# Patient Record
Sex: Female | Born: 1980 | Race: White | Hispanic: No | Marital: Single | Smoking: Current every day smoker
Health system: Southern US, Community
[De-identification: ages and names within clinical notes are randomized; demographics above are authoritative.]

## PROBLEM LIST (undated history)

## (undated) HISTORY — PX: TUBAL LIGATION: SHX77

## (undated) HISTORY — PX: APPENDECTOMY: SHX54

---

## 2002-09-22 ENCOUNTER — Other Ambulatory Visit: Admission: RE | Admit: 2002-09-22 | Discharge: 2002-09-22 | Payer: Self-pay

## 2003-10-14 ENCOUNTER — Other Ambulatory Visit: Admission: RE | Admit: 2003-10-14 | Discharge: 2003-10-14 | Payer: Self-pay | Admitting: Obstetrics and Gynecology

## 2004-04-28 ENCOUNTER — Inpatient Hospital Stay (HOSPITAL_COMMUNITY): Admission: AD | Admit: 2004-04-28 | Discharge: 2004-05-01 | Payer: Self-pay | Admitting: Obstetrics and Gynecology

## 2005-07-03 ENCOUNTER — Ambulatory Visit: Payer: Self-pay | Admitting: Family Medicine

## 2005-12-17 ENCOUNTER — Emergency Department (HOSPITAL_COMMUNITY): Admission: EM | Admit: 2005-12-17 | Discharge: 2005-12-17 | Payer: Self-pay | Admitting: Emergency Medicine

## 2005-12-18 ENCOUNTER — Emergency Department (HOSPITAL_COMMUNITY): Admission: EM | Admit: 2005-12-18 | Discharge: 2005-12-18 | Payer: Self-pay | Admitting: Emergency Medicine

## 2005-12-18 ENCOUNTER — Inpatient Hospital Stay (HOSPITAL_COMMUNITY): Admission: AD | Admit: 2005-12-18 | Discharge: 2005-12-20 | Payer: Self-pay | Admitting: Obstetrics and Gynecology

## 2006-01-14 ENCOUNTER — Emergency Department (HOSPITAL_COMMUNITY): Admission: EM | Admit: 2006-01-14 | Discharge: 2006-01-14 | Payer: Self-pay | Admitting: *Deleted

## 2006-01-20 ENCOUNTER — Inpatient Hospital Stay (HOSPITAL_COMMUNITY): Admission: EM | Admit: 2006-01-20 | Discharge: 2006-01-24 | Payer: Self-pay | Admitting: Emergency Medicine

## 2006-01-29 ENCOUNTER — Observation Stay (HOSPITAL_COMMUNITY): Admission: EM | Admit: 2006-01-29 | Discharge: 2006-01-30 | Payer: Self-pay | Admitting: Emergency Medicine

## 2006-05-29 ENCOUNTER — Ambulatory Visit: Payer: Self-pay | Admitting: Neonatology

## 2006-05-29 ENCOUNTER — Inpatient Hospital Stay (HOSPITAL_COMMUNITY): Admission: AD | Admit: 2006-05-29 | Discharge: 2006-06-03 | Payer: Self-pay | Admitting: Gynecology

## 2006-05-29 ENCOUNTER — Ambulatory Visit: Payer: Self-pay | Admitting: *Deleted

## 2006-09-18 ENCOUNTER — Encounter (INDEPENDENT_AMBULATORY_CARE_PROVIDER_SITE_OTHER): Payer: Self-pay | Admitting: Specialist

## 2006-09-18 ENCOUNTER — Ambulatory Visit: Payer: Self-pay | Admitting: Obstetrics & Gynecology

## 2006-11-01 ENCOUNTER — Other Ambulatory Visit: Admission: RE | Admit: 2006-11-01 | Discharge: 2006-11-01 | Payer: Self-pay | Admitting: Obstetrics and Gynecology

## 2006-11-01 ENCOUNTER — Ambulatory Visit: Payer: Self-pay | Admitting: Gynecology

## 2006-11-01 ENCOUNTER — Encounter (INDEPENDENT_AMBULATORY_CARE_PROVIDER_SITE_OTHER): Payer: Self-pay | Admitting: Specialist

## 2006-11-06 ENCOUNTER — Ambulatory Visit: Payer: Self-pay | Admitting: Obstetrics & Gynecology

## 2006-11-06 ENCOUNTER — Ambulatory Visit (HOSPITAL_COMMUNITY): Admission: RE | Admit: 2006-11-06 | Discharge: 2006-11-06 | Payer: Self-pay | Admitting: Obstetrics & Gynecology

## 2008-07-28 ENCOUNTER — Emergency Department (HOSPITAL_COMMUNITY): Admission: EM | Admit: 2008-07-28 | Discharge: 2008-07-28 | Payer: Self-pay | Admitting: Emergency Medicine

## 2010-11-28 ENCOUNTER — Emergency Department (HOSPITAL_COMMUNITY): Payer: Self-pay

## 2010-11-28 ENCOUNTER — Emergency Department (HOSPITAL_COMMUNITY)
Admission: EM | Admit: 2010-11-28 | Discharge: 2010-11-28 | Disposition: A | Discharge status: Home / Self Care | Payer: Self-pay | Attending: Emergency Medicine | Admitting: Emergency Medicine

## 2010-11-28 DIAGNOSIS — M79609 Pain in unspecified limb: Secondary | ICD-10-CM | POA: Insufficient documentation

## 2010-11-28 DIAGNOSIS — F17200 Nicotine dependence, unspecified, uncomplicated: Secondary | ICD-10-CM | POA: Insufficient documentation

## 2010-11-28 DIAGNOSIS — Y929 Unspecified place or not applicable: Secondary | ICD-10-CM | POA: Insufficient documentation

## 2010-11-28 DIAGNOSIS — X500XXA Overexertion from strenuous movement or load, initial encounter: Secondary | ICD-10-CM | POA: Insufficient documentation

## 2010-11-28 DIAGNOSIS — S93609A Unspecified sprain of unspecified foot, initial encounter: Secondary | ICD-10-CM | POA: Insufficient documentation

## 2010-11-28 DIAGNOSIS — M7989 Other specified soft tissue disorders: Secondary | ICD-10-CM | POA: Insufficient documentation

## 2011-02-02 NOTE — Discharge Summary (Signed)
[name], [name]               ACCOUNT NO.:  [id number]   MEDICAL RECORD NO.:  [id number]          PATIENT TYPE:  INP   LOCATION:  A418                          FACILITY:  [facility]   PHYSICIAN:  Tilda Burrow, M.D. DATE OF BIRTH:  04/21/1981   DATE OF ADMISSION:  01/20/2006  DATE OF DISCHARGE:  LH                                 DISCHARGE SUMMARY   REASON FOR ADMISSION:  Pregnancy at 10 weeks with hyperemesis, hypokalemia,  anxiety component.   HOSPITAL COURSE:  Suzanne Sandoval responded well to IV potassium.  Potassium level  is now at a normal limit.  Also, a Transderm scopolamine patch was added to  her treatment regimen, and she has responded well to that.  She has vomited  one time on May 9 and once this morning after drinking orange juice.  She is  to follow up with Dr. Despina Hidden Monday in the office for evaluation, and she  knows that if she has any complications she can call the office.      Zerita Boers, Suzanne Sandoval      Tilda Burrow, M.D.  Electronically Signed    DL/MEDQ  D:  95/62/1308  T:  01/24/2006  Job:  [id number]

## 2011-02-02 NOTE — Discharge Summary (Signed)
Suzanne Sandoval, Suzanne Sandoval               ACCOUNT NO.:  [id number]   MEDICAL RECORD NO.:  [id number]          PATIENT TYPE:  INP   LOCATION:  9302                          [facility]ACILITY:  [facility]   PHYSICIAN:  Ginger Carne, MD  DATE OF BIRTH:  03-17-1981   DATE OF ADMISSION:  05/29/2006  DATE OF DISCHARGE:                                 DISCHARGE SUMMARY   ADMISSION DIAGNOSIS:  Intrauterine pregnancy at 32-4/7 weeks on May 29, 2006.   DISCHARGE DIAGNOSES:  Patient with intrauterine pregnancy at 33 weeks  delivered via normal spontaneous vaginal delivery on June 01, 2006,  discharged on June 03, 2006.   PRENATAL LABORATORIES:  B positive antibody screen negative, rubella immune,  hepatitis B negative, syphilis nonreactive, HIV nonreactive, gonorrhea  negative, chlamydia negative, GBS negative.   HOSPITAL COURSE:  This is a 30 year old G2, P1-0-0-1 at 32-4/7 weeks,  transferred from [facility] for preterm labor, GBS unknown with  contractions every 5-10 minutes, also with hyperemesis, vomited 6 times  overnight.  Prior baby was normal spontaneous vaginal delivery.  The patient  had been given one dose of Terbutaline prior to transfer and had been  started on magnesium.  The patient tested negative on vaginal exam and  Nitrazine test at bedside.  Bedside ultrasound showed intact membranes.  Patient admitted for preterm labor with follow up ultrasound, betamethasone,  ampicillin and Indocin.  Magnesium stopped at this time.  Indocin was later  stopped, and the patient was restarted on magnesium.  Epidural was placed  for what patient was describing as severe pain.  The patient was also very  nauseous and given Zofran and Phenergan.  The follow up ultrasound showed  systolic position.  AFI 16, EFW 1839 g and posterior placenta.  The  patient's gastroenteritis improved with fluids and antiemetics.  Procardia  was later started after second dose of betamethasone.  The  patient began to  develop shortness of breath and desaturation on room air on September 14.  The patient was then diagnosed with pulmonary edema on x-ray and treated  with several doses of Lasix and O2 which later resolved postpartum.  On  June 01, 2006, Procardia was stopped and the patient was followed for  expectant management.  Ampicillin as discontinued regarding finding of GBS  negative.  Spontaneous vaginal delivery occurred on September 15 at  approximately 10:30 a.m. with one push.  NICU was at the delivery.  Apgar  scores 8 and 8.  There were no lacerations.  Baby was transferred to the  NICU.   Postpartum course was complicated by pulmonary edema which resolved  following treatment with Lasix and O2.  The patient also had a hypokalemia.  The patient was treated with three doses of K-Dur on September 16.   DISCHARGE MEDICATIONS:  1. K-Dur 20 mEq t.i.d. x2 days.  2. Ibuprofen 600 mg q.6 h p.r.n.  3. Prenatal vitamins every day.  4. Colace 100 mg.   DISCHARGE INSTRUCTIONS:  Patient discharged in stable condition today,  September 17, to home, to follow up in six weeks at the [facility] and  for 6 weeks of pelvic rest.     ______________________________  Juline Patch, M.D.      Ginger Carne, MD  Electronically Signed    Suzanne Sandoval  D:  06/03/2006  T:  06/03/2006  Job:  [id number]

## 2011-02-02 NOTE — Discharge Summary (Signed)
[name], Suzanne Sandoval               ACCOUNT NO.:  [id number]   MEDICAL RECORD NO.:  [id number]          PATIENT TYPE:  INP   LOCATION:  A428                          FACILITY:  [facility]   PHYSICIAN:  Tilda Burrow, M.D. DATE OF BIRTH:  [birth year]   DATE OF ADMISSION:  12/18/2005  DATE OF DISCHARGE:  04/05/2007LH                                 DISCHARGE SUMMARY   ADMISSION DIAGNOSES:  1.  Pregnancy 8 to [redacted] weeks gestation.  2.  Hyperemesis gravidarum.   DISCHARGE DIAGNOSES:  1.  Pregnancy 8 to [redacted] weeks gestation.  2.  Hyperemesis gravidarum.  3.  Hypokalemia, corrected (metabolic abnormality, corrected).   HISTORY OF PRESENT ILLNESS:  This 30 year old female was admitted after  repeatedly showing up to labor and delivery with dramatic presentation.  She  initially presented to [facility] in the mid day having been  unwilling to wait for 3:30 P.M. appointment in our office. From the  emergency room she received light fluids, complained that she was not better  and presented to our labor and delivery for fluid hydration and observation.   LABORATORY DATA:  Laboratory evaluation identified hyperemesis and low  potassium with serum potassium 2.5.   HOSPITAL COURSE:  She was admitted and received intravenous fluids,  antiemetics.  On December 19, 2005 she was counseled to try to have realistic  expectations as she wanted to be pain-free and nausea-free.  Within an  additional day she was able to keep Jell-O and minimal fluids down and had  multiple bowel movements and was considered hydrated.  She was therefore  discharged home for followup in one week at [facility].      Tilda Burrow, M.D.  Electronically Signed     JVF/MEDQ  D:  02/05/2006  T:  02/06/2006  Job:  [id number]

## 2011-02-02 NOTE — Group Therapy Note (Signed)
[name], Sandoval               ACCOUNT NO.:  [id number]   MEDICAL RECORD NO.:  [id number]          PATIENT TYPE:  OBV   LOCATION:  A427                          FACILITY:  [facility]   PHYSICIAN:  Tilda Burrow, M.D. DATE OF BIRTH:  08/12/1981   DATE OF PROCEDURE:  DATE OF DISCHARGE:  01/30/2006                                   PROGRESS NOTE   Suzanne Sandoval is about [redacted] weeks pregnant and was sent up from the emergency room  after being evaluated down there.  She came in with a complaint with a near-  syncopal episode while she was standing in line at [employer].  She stated to the  emergency room that she ate crackers and eggs for breakfast; however, she  has told me that she has eaten nothing in the past three days.  Her white  count of 14.4.  She has a potassium level of 2.6.  She has not had any  vomiting since she has been here.  She does still complain of some nausea.  She does not complain of any pain.   VITAL SIGNS:  Pulse is 111.  Blood pressures are 100-120s/60-80s.   IMPRESSION:  1.  Intrauterine pregnancy at about [redacted] weeks gestation, near-syncopal      episode, secondary to dehydration.  2.  Hypokalemia.   PLAN:  We will replace her potassium with some mini bags of potassium and  check a MET-7 again in the morning.  We will also give her antiemetics  p.r.n. through her IV and advance diet as tolerated.    Please refer to prenatal record for PMH/SH/Allergies  jvferg      Suzanne Sandoval, P.A.      Tilda Burrow, M.D.  Electronically Signed    RRK/MEDQ  D:  01/29/2006  T:  01/30/2006  Job:  [id number]

## 2011-02-02 NOTE — Op Note (Signed)
Suzanne Sandoval, Suzanne Sandoval               ACCOUNT NO.:  [id number]   MEDICAL RECORD NO.:  [id number]          PATIENT TYPE:  AMB   LOCATION:                                [facility]   PHYSICIAN:  Lesly Dukes, M.D. DATE OF BIRTH:  [birth year]   DATE OF PROCEDURE:  11/06/2006  DATE OF DISCHARGE:  11/01/2006                               OPERATIVE REPORT   PREOPERATIVE DIAGNOSIS:  30 year old para 1-1-1-2 female who desires  permanent sterilization after in depth counseling.   POSTOPERATIVE DIAGNOSIS:  30 year old para 1-1-1-2 female who desires  permanent sterilization after in depth counseling.   PROCEDURE:  Laparoscopic bilateral tubal ligation.   SURGEON:  Lesly Dukes, M.D.   ASSISTANT:  None.   ANESTHESIA:  General.   SPECIMENS:  None.   ESTIMATED BLOOD LOSS:  Minimal.   COMPLICATIONS:  None.   FINDINGS:  Grossly normal uterus, ovaries, and fallopian tubes, with  good placement of Filshie clips.  Pictures taken.   DESCRIPTION OF PROCEDURE:  After informed consent was obtained, the  patient was taken to the operating room where general anesthesia was  induced.  The patient was placed in the dorsal lithotomy position and  prepared and draped in the normal sterile fashion.  The bladder was  emptied.   A Hulka clip was placed on the cervix.  Gown and gloves were changed.  The infraumbilical skin incision was made with the scalpel and carried  down to the fascia.  The fascia was incised, and the peritoneum was  entered bluntly.  This was all done via the open method for laparoscopy.  The Hasson trocar was introduced into the abdomen, and pneumoperitoneum  was achieved.  The operating laparoscope was placed into the abdomen,  and each fallopian tube was brought into the surgical field.  A Filshie  clip was placed transversely across the entire fallopian tube.  This was  done on both sides.  Pictures were taken.  All instruments were removed  from the patient's  abdomen, and pneumoperitoneum was released.  The  fascia was closed with 0 Vicryl, and the skin was closed with 4-0 Vicryl  in a subcuticular fashion.  The Hulka clip was removed from the cervix,  and the cervix was noted to be hemostatic.   The patient tolerated the procedure well.  Sponge, lap, needle, and  instrument counts were correct x2.  The patient went to the recovery  room in stable condition.           ______________________________  Lesly Dukes, M.D.     KHL/MEDQ  D:  11/22/2006  T:  11/22/2006  Job:  [id number]

## 2011-02-02 NOTE — Group Therapy Note (Signed)
Suzanne Sandoval, GRANT NO.:  [id number]   MEDICAL RECORD NO.:  [id number]          PATIENT TYPE:  WOC   LOCATION:  [facility]                   [facility]ACILITY[facility]  [facility]   PHYSICIAN:  Elsie Lincoln, MD      DATE OF BIRTH:  15-Feb-1981   DATE OF SERVICE:  09/18/2006                                  CLINIC NOTE   The patient is a 30 year old G3, para 1-1-1-2, who no longer desires  fertility.  She first signed papers back in September.  She did not get  it done post partum because she signed after her delivery.  She has had  a long time to think about this, and she was offered all other forms of  birth control including an IUD which has about the same failure rate as  a tubal, and she says she would like to have a tubal.  She understands  the risks of this procedure are bleeding, infection, and damage to  intraabdominal organs, including bowel, bladder, uterus, ovaries, and  ureters.  Patient understands that if any of these organs are hurt, then  she will need a laparotomy to fix them.  She also understands that there  is about a 7 out of a 1000 failure risk with the Filshie clips, which is  less than 1%.   PAST MEDICAL HISTORY:  Denies.   PAST SURGICAL HISTORY:  Laparotomy for an appendix.  She has had 2  vaginal deliveries and 1 miscarriage.   GYN HISTORY:  Negative for ovarian cyst, fibroid tumor.  She has had an  abnormal Pap smears and had __________, but none since.   FAMILY HISTORY:  Emelia Loron and grandmother had a heart attack.  Grandfather has had cancer.   SOCIAL HISTORY:  Tobacco 1/2 pack per day for 6 years.  No alcohol.  Positive for caffeinated beverages, and positive for smoking marijuana  in the past.   REVIEW OF SYSTEMS:  Positive for loss of urine with coughing, sneezing,  and __________.   PHYSICAL EXAMINATION:  Temperature 97.7, pulse 65, blood pressure  119/78.  Weight 133.6 pounds, height 5 foot 4.  GENERAL:  Well-nourished, well-developed,  no apparent distress.  HEENT:  Normocephalic, atraumatic.  NECK:  Normal size thyroid.  LUNGS:  Clear to auscultation bilaterally.  HEART:  Regular rate and rhythm.  ABDOMEN:  Soft, nontender, no hernia.  Well-healed skin incision from a  laparotomy.  GENITALIA:  Tanner 5.  Vagina pink, normal rugae.  Cervix closed,  nontender.  Uterus anteverted, nontender.  EXTREMITIES:  Nontender.   ASSESSMENT/PLAN:  A 30 year old female for bilateral tubal ligation.  1. Patient extensively counseled and she wants the bilateral tubal      ligation, which I will do for her.  2. Risks of the procedure reviewed.  3. Pap smear cultures done today.  4. Patient to be scheduled.           ______________________________  Elsie Lincoln, MD     KL/MEDQ  D:  09/18/2006  T:  09/18/2006  Job:  [id number]

## 2011-02-02 NOTE — H&P (Signed)
NAMEJOVAN, Suzanne Sandoval               ACCOUNT NO.:  [id number]   MEDICAL RECORD NO.:  [id number]          PATIENT TYPE:  INP   LOCATION:  A428                          FACILITY:  [facility]   PHYSICIAN:  Tilda Burrow, M.D. DATE OF BIRTH:  1981-05-24   DATE OF ADMISSION:  12/18/2005  DATE OF DISCHARGE:  04/05/2007LH                                HISTORY & PHYSICAL   ADMISSION DIAGNOSES:  1.  Pregnancy, 8 to [redacted] weeks gestation.  2.  Hyperemesis gravidarum.   HISTORY OF PRESENT ILLNESS:  This 30 year old multiparous G2, P78 female, LMP  October 13, 2005, is now at approximately [redacted] weeks gestation is seen December 18, 2005, in the office and due to hyperemesis gravidarum.  She has been  established pregnancy care here to date but was scheduled for an appointment  at 3:30 today.  She chose not to wait but presented to [facility].  She had a previous admission there for hyperemesis.  She presents having  been seen in the emergency room this a.m. on December 18, 2005, at [facility] and received a liter of fluid there.  Dr. __Mutch__ called  personally and has made arrangements for her to be presented here.  She  presents in dramatic style to the office with nausea, hyperventilation,  vomiting, carrying a bag with her and being very agitated.   Vital signs upon arrival here include blood pressure 140/66, pulse 70s,  respirations 26, temperature previously noted 97.6.  She rates her pain a  10/10 in the emergency room.  This dramatically anxious Caucasian female has  unremarkable HEENT, regular rate and rhythm.  Abdomen soft, bowel sounds  present.  Pelvic exam deferred at this time.  Extremities grossly normal.  Patient is ambulatory.  As a matter of fact, she cannot keep still due to  anxiety.  In talking with family, she is accompanied by her mother, who  states this patient has always been that way with high anxiety factors.   LABORATORY DATA:  From [facility] shows urinalysis  showing specific  gravity not done but urine ketones are greater than 80 mg/mL with BUN 7,  creatinine 0.7, potassium 2.5.   IMPRESSION:  1.  Hyperemesis gravidarum.  2.  Hypokalemia.  3.  Anxiety component.  4.  Intrauterine pregnancy at 9 weeks.   PLAN:  Admit for IV fluid hydration to [facility].  Will attempt  antiemetics, Reglan and assessment of social factors.      Tilda Burrow, M.D.  Electronically Signed     JVF/MEDQ  D:  12/24/2005  T:  12/24/2005  Job:  [id number]

## 2011-02-02 NOTE — Discharge Summary (Signed)
Suzanne Sandoval, Suzanne Sandoval               ACCOUNT NO.:  [id number]   MEDICAL RECORD NO.:  [id number]           PATIENT TYPE:   LOCATION:                                FACILITY:  [facility]   PHYSICIAN:  Tilda Burrow, M.D.      DATE OF BIRTH:   DATE OF ADMISSION:  01/29/2006  DATE OF DISCHARGE:  05/16/2007LH                                 DISCHARGE SUMMARY   ADMISSION DIAGNOSES:  1.  Pregnancy, 15 weeks' gestation.  2.  Near syncopal episode.  3.  Mild dehydration.  4.  Mild hypokalemia.   PROCEDURE:  IV fluid hydration.   HISTORY OF PRESENT ILLNESS:  This is a 104 weeks' pregnant, primiparous  female seen in the emergency room after a near syncopal episode, standing in  line in a cafeteria with some question as to how much she had been able to  take in for the last three days.   HOSPITAL COURSE:  The patient was evaluated with blood pressure 109/67  sitting, pulse 90 with increase in pulse to 111 with increase in blood  pressure 138/77 when standing, suggesting adequate compensation for her  hypotension.  She was observed overnight, given vigorous fluid hydration,  with admitting potassium of 2.6, responding to potassium supplementation  overnight.  Four __________  of 10 mEq potassium chloride were administered  at 30 minutes each bag.  The patient subsequently was stable for discharge  the following a.m. for follow-up in our office.   DISCHARGE DIAGNOSES:  1.  Hyperemesis.  2.  Mild dehydration.  3.  Hypokalemia, corrected.      Tilda Burrow, M.D.  Electronically Signed     JVF/MEDQ  D:  03/13/2006  T:  03/13/2006  Job:  [id number]

## 2011-10-07 ENCOUNTER — Encounter (HOSPITAL_COMMUNITY): Payer: Self-pay | Admitting: *Deleted

## 2011-10-07 ENCOUNTER — Emergency Department (HOSPITAL_COMMUNITY)
Admission: EM | Admit: 2011-10-07 | Discharge: 2011-10-07 | Disposition: A | Discharge status: Home / Self Care | Payer: Medicaid Other | Attending: Emergency Medicine | Admitting: Emergency Medicine

## 2011-10-07 DIAGNOSIS — K029 Dental caries, unspecified: Secondary | ICD-10-CM | POA: Insufficient documentation

## 2011-10-07 DIAGNOSIS — K089 Disorder of teeth and supporting structures, unspecified: Secondary | ICD-10-CM | POA: Insufficient documentation

## 2011-10-07 DIAGNOSIS — F17200 Nicotine dependence, unspecified, uncomplicated: Secondary | ICD-10-CM | POA: Insufficient documentation

## 2011-10-07 MED ORDER — AMOXICILLIN 500 MG PO CAPS: ORAL_CAPSULE | ORAL | Status: DC

## 2011-10-07 MED ORDER — HYDROCODONE-ACETAMINOPHEN 5-325 MG PO TABS: 1.0000 | ORAL_TABLET | ORAL | Status: AC | PRN

## 2011-10-07 MED ORDER — IBUPROFEN 800 MG PO TABS: 800.0000 mg | ORAL_TABLET | Freq: Three times a day (TID) | ORAL | Status: AC

## 2011-10-07 NOTE — ED Provider Notes (Signed)
Medical screening examination/treatment/procedure(s) were performed by non-physician practitioner and as supervising physician I was immediately available for consultation/collaboration. Julie Nay, MD, FACEP   Susie Pousson L Anastasio Wogan, MD 10/07/11 1909 

## 2011-10-07 NOTE — ED Notes (Signed)
Pt a/ox4. Resp even and unlabored. NAD at this time. D/C instructions reviewed with pt. Pt verbalized understanding. Pt ambulated to lobby with steady gate.  

## 2011-10-07 NOTE — ED Provider Notes (Signed)
History     CSN: [id number]  Arrival date & time 10/07/11  1332   First MD Initiated Contact with Patient 10/07/11 1550      Chief Complaint  Patient presents with  . Dental Pain    (Consider location/radiation/quality/duration/timing/severity/associated sxs/prior treatment) Patient is a 31 y.o. female presenting with tooth pain. The history is provided by the patient.  Dental PainThe primary symptoms include mouth pain and headaches. Primary symptoms do not include shortness of breath or cough. The symptoms began 2 days ago. The symptoms are worsening.  The headache is not associated with photophobia.  Additional symptoms include: dental sensitivity to temperature, gum swelling, gum tenderness, jaw pain and facial swelling. Additional symptoms do not include: nosebleeds.    History reviewed. No pertinent past medical history.  Past Surgical History  Procedure Date  . Appendectomy   . Tubal ligation     History reviewed. No pertinent family history.  History  Substance Use Topics  . Smoking status: Current Everyday Smoker -- 1.0 packs/day    Types: Cigarettes  . Smokeless tobacco: Not on file  . Alcohol Use: No    OB History    Grav Para Term Preterm Abortions TAB SAB Ect Mult Living                  Review of Systems  Constitutional: Negative for activity change.       All ROS Neg except as noted in HPI  HENT: Positive for facial swelling. Negative for nosebleeds and neck pain.   Eyes: Negative for photophobia and discharge.  Respiratory: Negative for cough, shortness of breath and wheezing.   Cardiovascular: Negative for chest pain and palpitations.  Gastrointestinal: Negative for abdominal pain and blood in stool.  Genitourinary: Negative for dysuria, frequency and hematuria.  Musculoskeletal: Negative for back pain and arthralgias.  Skin: Negative.   Neurological: Positive for headaches. Negative for dizziness, seizures and speech difficulty.    Psychiatric/Behavioral: Negative for hallucinations and confusion.    Allergies  Review of patient's allergies indicates no known allergies.  Home Medications   Current Outpatient Rx  Name Route Sig Dispense Refill  . AMOXICILLIN 500 MG PO CAPS  2 po bid with food 14 capsule 0  . HYDROCODONE-ACETAMINOPHEN 5-325 MG PO TABS Oral Take 1 tablet by mouth every 4 (four) hours as needed for pain. 20 tablet 0  . IBUPROFEN 800 MG PO TABS Oral Take 1 tablet (800 mg total) by mouth 3 (three) times daily. 21 tablet 0    BP 116/79  Pulse 74  Temp(Src) 98 F (36.7 C) (Oral)  Resp 20  Ht 5\' 4"  (1.626 m)  Wt 130 lb (58.968 kg)  BMI 22.31 kg/m2  SpO2 100%  LMP 09/06/2011  Physical Exam  Nursing note and vitals reviewed. Constitutional: She is oriented to person, place, and time. She appears well-developed and well-nourished.  Non-toxic appearance.  HENT:  Head: Normocephalic.  Right Ear: Tympanic membrane and external ear normal.  Left Ear: Tympanic membrane and external ear normal.       Multiple dental caries of the upper and lower gum areas. Moderate swelling at the left lower molar area. Airway is patent  Eyes: EOM and lids are normal. Pupils are equal, round, and reactive to light.  Neck: Normal range of motion. Neck supple. Carotid bruit is not present.  Cardiovascular: Normal rate, regular rhythm, normal heart sounds, intact distal pulses and normal pulses.   Pulmonary/Chest: Breath sounds normal. No respiratory distress.  Abdominal: Soft. Bowel sounds are normal. There is no tenderness. There is no guarding.  Musculoskeletal: Normal range of motion.  Lymphadenopathy:       Head (right side): No submandibular adenopathy present.       Head (left side): No submandibular adenopathy present.    She has no cervical adenopathy.  Neurological: She is alert and oriented to person, place, and time. She has normal strength. No cranial nerve deficit or sensory deficit.  Skin: Skin is warm  and dry.  Psychiatric: She has a normal mood and affect. Her speech is normal.    ED Course  Procedures (including critical care time) Pulse oximetry 100% on room air. Within normal limits by my interpretation. Labs Reviewed - No data to display No results found.   1. Dental caries       MDM  I have reviewed nursing notes, vital signs, and all appropriate lab and imaging results for this patient.        Kathie Dike, [place] 10/07/11 [phone]

## 2011-10-07 NOTE — ED Notes (Signed)
Pt c/o pain and swelling to her left lower back tooth since Friday.

## 2011-10-07 NOTE — ED Notes (Signed)
Pt presents with pain and swelling to left lower back tooth since Thursday. NAD at this time. No facial swelling noted. Pt states she tried Ibuprofen and an "old Percocet". Pt denies relief.

## 2017-04-30 ENCOUNTER — Emergency Department (HOSPITAL_COMMUNITY): Payer: Self-pay

## 2017-04-30 ENCOUNTER — Encounter (HOSPITAL_COMMUNITY): Payer: Self-pay

## 2017-04-30 ENCOUNTER — Emergency Department (HOSPITAL_COMMUNITY)
Admission: EM | Admit: 2017-04-30 | Discharge: 2017-04-30 | Disposition: A | Discharge status: Home / Self Care | Payer: Self-pay | Attending: Emergency Medicine | Admitting: Emergency Medicine

## 2017-04-30 DIAGNOSIS — Y9389 Activity, other specified: Secondary | ICD-10-CM | POA: Insufficient documentation

## 2017-04-30 DIAGNOSIS — S0990XA Unspecified injury of head, initial encounter: Secondary | ICD-10-CM | POA: Insufficient documentation

## 2017-04-30 DIAGNOSIS — Y929 Unspecified place or not applicable: Secondary | ICD-10-CM | POA: Insufficient documentation

## 2017-04-30 DIAGNOSIS — T07XXXA Unspecified multiple injuries, initial encounter: Secondary | ICD-10-CM | POA: Insufficient documentation

## 2017-04-30 DIAGNOSIS — F17210 Nicotine dependence, cigarettes, uncomplicated: Secondary | ICD-10-CM | POA: Insufficient documentation

## 2017-04-30 DIAGNOSIS — Y999 Unspecified external cause status: Secondary | ICD-10-CM | POA: Insufficient documentation

## 2017-04-30 MED ORDER — IBUPROFEN 600 MG PO TABS: 600.0000 mg | ORAL_TABLET | Freq: Four times a day (QID) | ORAL | 0 refills | Status: DC | PRN

## 2017-04-30 MED ORDER — HYDROCODONE-ACETAMINOPHEN 5-325 MG PO TABS: 2.0000 | ORAL_TABLET | ORAL | 0 refills | Status: DC | PRN

## 2017-04-30 MED ORDER — TETANUS-DIPHTH-ACELL PERTUSSIS 5-2.5-18.5 LF-MCG/0.5 IM SUSP
0.5000 mL | Freq: Once | INTRAMUSCULAR | Status: DC
  Filled 2017-04-30: qty 0.5

## 2017-04-30 MED ORDER — HYDROCODONE-ACETAMINOPHEN 5-325 MG PO TABS
2.0000 | ORAL_TABLET | Freq: Once | ORAL | Status: AC
  Administered 2017-04-30: 2 via ORAL
  Filled 2017-04-30: qty 2
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
## 2017-04-30 NOTE — Discharge Instructions (Signed)
Return if any problems.  Ibuprofen and pain medication as needed.
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
## 2017-12-04 ENCOUNTER — Encounter (HOSPITAL_COMMUNITY): Payer: Self-pay | Admitting: Emergency Medicine

## 2017-12-04 ENCOUNTER — Inpatient Hospital Stay (HOSPITAL_COMMUNITY)
Admission: EM | Admit: 2017-12-04 | Discharge: 2017-12-07 | DRG: 558 | Disposition: A | Discharge status: Home / Self Care | Payer: Self-pay | Attending: Internal Medicine | Admitting: Internal Medicine

## 2017-12-04 ENCOUNTER — Emergency Department (HOSPITAL_COMMUNITY): Payer: Self-pay

## 2017-12-04 ENCOUNTER — Other Ambulatory Visit: Payer: Self-pay

## 2017-12-04 DIAGNOSIS — E876 Hypokalemia: Secondary | ICD-10-CM | POA: Diagnosis present

## 2017-12-04 DIAGNOSIS — L03115 Cellulitis of right lower limb: Secondary | ICD-10-CM | POA: Diagnosis present

## 2017-12-04 DIAGNOSIS — M7041 Prepatellar bursitis, right knee: Principal | ICD-10-CM | POA: Diagnosis present

## 2017-12-04 DIAGNOSIS — F17210 Nicotine dependence, cigarettes, uncomplicated: Secondary | ICD-10-CM | POA: Diagnosis present

## 2017-12-04 DIAGNOSIS — Z720 Tobacco use: Secondary | ICD-10-CM

## 2017-12-04 DIAGNOSIS — M7040 Prepatellar bursitis, unspecified knee: Secondary | ICD-10-CM

## 2017-12-04 DIAGNOSIS — R739 Hyperglycemia, unspecified: Secondary | ICD-10-CM | POA: Diagnosis present

## 2017-12-04 DIAGNOSIS — M25461 Effusion, right knee: Secondary | ICD-10-CM

## 2017-12-04 LAB — COMPREHENSIVE METABOLIC PANEL
ALT: 14 U/L (ref 14–54)
AST: 21 U/L (ref 15–41)
Albumin: 3.8 g/dL (ref 3.5–5.0)
Alkaline Phosphatase: 75 U/L (ref 38–126)
Anion gap: 12 (ref 5–15)
BUN: 11 mg/dL (ref 6–20)
CO2: 25 mmol/L (ref 22–32)
Calcium: 9.3 mg/dL (ref 8.9–10.3)
Chloride: 98 mmol/L — ABNORMAL LOW (ref 101–111)
Creatinine, Ser: 0.72 mg/dL (ref 0.44–1.00)
GFR calc Af Amer: >60 mL/min (ref >60)
GFR calc non Af Amer: >60 mL/min (ref >60)
Glucose, Bld: 182 mg/dL — ABNORMAL HIGH (ref 65–99)
Potassium: 3.4 mmol/L — ABNORMAL LOW (ref 3.5–5.1)
Sodium: 135 mmol/L (ref 135–145)
Total Bilirubin: 0.8 mg/dL (ref 0.3–1.2)
Total Protein: 7.3 g/dL (ref 6.5–8.1)

## 2017-12-04 LAB — CBC WITH DIFFERENTIAL/PLATELET
Basophils Absolute: 0 10*3/uL (ref 0.0–0.1)
Basophils Relative: 0 %
Eosinophils Absolute: 0 10*3/uL (ref 0.0–0.7)
Eosinophils Relative: 0 %
HCT: 41.7 % (ref 36.0–46.0)
Hemoglobin: 13.4 g/dL (ref 12.0–15.0)
Lymphocytes Relative: 12 %
Lymphs Abs: 1.8 10*3/uL (ref 0.7–4.0)
MCH: 30 pg (ref 26.0–34.0)
MCHC: 32.1 g/dL (ref 30.0–36.0)
MCV: 93.5 fL (ref 78.0–100.0)
Monocytes Absolute: 0.7 10*3/uL (ref 0.1–1.0)
Monocytes Relative: 5 %
Neutro Abs: 13.1 10*3/uL — ABNORMAL HIGH (ref 1.7–7.7)
Neutrophils Relative %: 83 %
Platelets: 316 10*3/uL (ref 150–400)
RBC: 4.46 MIL/uL (ref 3.87–5.11)
RDW: 13.4 % (ref 11.5–15.5)
WBC: 15.7 10*3/uL — ABNORMAL HIGH (ref 4.0–10.5)

## 2017-12-04 LAB — SEDIMENTATION RATE: Sed Rate: 20 mm/hr (ref 0–22)

## 2017-12-04 LAB — GLUCOSE, CAPILLARY: GLUCOSE-CAPILLARY: 155 mg/dL — AB (ref 65–99)

## 2017-12-04 MED ORDER — ACETAMINOPHEN 325 MG PO TABS: 650.0000 mg | ORAL_TABLET | Freq: Four times a day (QID) | ORAL | Status: DC | PRN

## 2017-12-04 MED ORDER — ONDANSETRON HCL 4 MG PO TABS: 4.0000 mg | ORAL_TABLET | Freq: Four times a day (QID) | ORAL | Status: DC | PRN

## 2017-12-04 MED ORDER — POTASSIUM CHLORIDE 20 MEQ/15ML (10%) PO SOLN
40.0000 meq | Freq: Once | ORAL | Status: AC
  Administered 2017-12-04: 40 meq via ORAL
  Filled 2017-12-04: qty 30

## 2017-12-04 MED ORDER — ONDANSETRON HCL 4 MG/2ML IJ SOLN: 4.0000 mg | Freq: Four times a day (QID) | INTRAMUSCULAR | Status: DC | PRN

## 2017-12-04 MED ORDER — SODIUM CHLORIDE 0.9 % IV SOLN
INTRAVENOUS | Status: DC
  Administered 2017-12-04: 17:00:00 via INTRAVENOUS

## 2017-12-04 MED ORDER — MORPHINE SULFATE (PF) 2 MG/ML IV SOLN
1.0000 mg | INTRAVENOUS | Status: DC | PRN
  Administered 2017-12-04 – 2017-12-06 (×11): 1 mg via INTRAVENOUS
  Filled 2017-12-04 (×11): qty 1

## 2017-12-04 MED ORDER — CLINDAMYCIN PHOSPHATE 600 MG/50ML IV SOLN
600.0000 mg | Freq: Once | INTRAVENOUS | Status: AC
  Administered 2017-12-04: 600 mg via INTRAVENOUS
  Filled 2017-12-04: qty 50

## 2017-12-04 MED ORDER — CEFAZOLIN SODIUM-DEXTROSE 1-4 GM/50ML-% IV SOLN
1.0000 g | Freq: Once | INTRAVENOUS | Status: AC
  Administered 2017-12-04: 1 g via INTRAVENOUS
  Filled 2017-12-04: qty 50

## 2017-12-04 MED ORDER — TETANUS-DIPHTH-ACELL PERTUSSIS 5-2.5-18.5 LF-MCG/0.5 IM SUSP
0.5000 mL | Freq: Once | INTRAMUSCULAR | Status: AC
  Administered 2017-12-04: 0.5 mL via INTRAMUSCULAR
  Filled 2017-12-04: qty 0.5

## 2017-12-04 MED ORDER — HYDROMORPHONE HCL 1 MG/ML IJ SOLN
1.0000 mg | Freq: Once | INTRAMUSCULAR | Status: AC
  Administered 2017-12-04: 1 mg via INTRAVENOUS
  Filled 2017-12-04: qty 1

## 2017-12-04 MED ORDER — CLINDAMYCIN PHOSPHATE 600 MG/50ML IV SOLN: 600.0000 mg | Freq: Four times a day (QID) | INTRAVENOUS | Status: DC

## 2017-12-04 MED ORDER — NICOTINE 14 MG/24HR TD PT24
14.0000 mg | MEDICATED_PATCH | Freq: Every day | TRANSDERMAL | Status: DC
  Administered 2017-12-05 – 2017-12-07 (×3): 14 mg via TRANSDERMAL
  Filled 2017-12-04 (×5): qty 1

## 2017-12-04 MED ORDER — CLINDAMYCIN PHOSPHATE 600 MG/50ML IV SOLN
600.0000 mg | Freq: Three times a day (TID) | INTRAVENOUS | Status: DC
  Administered 2017-12-04 – 2017-12-05 (×3): 600 mg via INTRAVENOUS
  Filled 2017-12-04 (×3): qty 50

## 2017-12-04 MED ORDER — INSULIN ASPART 100 UNIT/ML ~~LOC~~ SOLN
0.0000 [IU] | SUBCUTANEOUS | Status: DC
  Administered 2017-12-05 (×2): 3 [IU] via SUBCUTANEOUS

## 2017-12-04 MED ORDER — ACETAMINOPHEN 650 MG RE SUPP: 650.0000 mg | Freq: Four times a day (QID) | RECTAL | Status: DC | PRN

## 2017-12-04 MED ORDER — SODIUM CHLORIDE 0.9 % IV SOLN
INTRAVENOUS | Status: AC
  Administered 2017-12-04: 20:00:00 via INTRAVENOUS

## 2017-12-04 MED ORDER — CEFAZOLIN SODIUM-DEXTROSE 1-4 GM/50ML-% IV SOLN
1.0000 g | Freq: Three times a day (TID) | INTRAVENOUS | Status: DC
  Administered 2017-12-05: 1 g via INTRAVENOUS
  Filled 2017-12-04 (×3): qty 50

## 2017-12-04 MED ORDER — PROCHLORPERAZINE EDISYLATE 5 MG/ML IJ SOLN
5.0000 mg | Freq: Once | INTRAMUSCULAR | Status: AC
  Administered 2017-12-04: 5 mg via INTRAVENOUS
  Filled 2017-12-04: qty 2

## 2017-12-04 MED ORDER — ENOXAPARIN SODIUM 40 MG/0.4ML ~~LOC~~ SOLN
40.0000 mg | SUBCUTANEOUS | Status: DC
  Administered 2017-12-04 – 2017-12-06 (×3): 40 mg via SUBCUTANEOUS
  Filled 2017-12-04 (×3): qty 0.4

## 2017-12-04 NOTE — H&P (Addendum)
History and Physical    Suzanne Sandoval ZOX:[id number] DOB: 31-Dec-1980 DOA: 12/04/2017  PCP: Patient, No Pcp Per   Patient coming from: Home  Chief Complaint: R knee swelling and pain  HPI: Suzanne Sandoval is a 37 y.o. female with medical history significant for going tobacco abuse who presented to the emergency department after she woke up this morning with severe pain and swelling to her right knee.  She noted some erythema, but no drainage from an abrasion from the previous night where she was ripping up carpet's.  She apparently received a cut on her knee from a staple in the carpeting.  The pain is severe enough to where she cannot weight-bear.  She also cannot flex her knee due to the swelling. She seems to think that she had an episode of fever and chills overnight. She denies any chest pain, shortness of breath, nausea, vomiting, or diarrhea.   ED Course: Vital signs are stable and laboratory demonstrates leukocytosis of 15,700, potassium of 3.4, and glucose of 182.  4 view x-ray of the right knee demonstrates what appears to be a suprapatellar effusion with no significant soft tissue edema.  Clinical examination is not concerning for compartment syndrome or abscess.  She has been started on some IV fluid as well as Dilaudid for pain control and given Ancef and clindamycin.  Review of Systems: All others reviewed and otherwise negative.  History reviewed. No pertinent past medical history.  Past Surgical History:  Procedure Laterality Date  . APPENDECTOMY    . TUBAL LIGATION       reports that she has been smoking cigarettes.  She has been smoking about 1.00 pack per day. she has never used smokeless tobacco. She reports that she uses drugs. Drugs: Marijuana and Cocaine. She reports that she does not drink alcohol.  No Known Allergies  History reviewed. No pertinent family history.  Prior to Admission medications   Medication Sig Start Date End Date Taking? Authorizing  Provider  amoxicillin (AMOXIL) 500 MG capsule 2 po bid with food 10/07/11   Lily Kocher, PA-C  HYDROcodone-acetaminophen (NORCO/VICODIN) 5-325 MG tablet Take 2 tablets by mouth every 4 (four) hours as needed. 04/30/17   Fransico Meadow, PA-C  ibuprofen (ADVIL,MOTRIN) 600 MG tablet Take 1 tablet (600 mg total) by mouth every 6 (six) hours as needed. 04/30/17   Fransico Meadow, PA-C    Physical Exam: Vitals:   12/04/17 1532  BP: 118/72  Pulse: 85  Resp: 16  Temp: 98.1 F (36.7 C)  TempSrc: Oral  SpO2: 100%  Weight: 54.4 kg (120 lb)  Height: '5\' 5"'$  (1.651 m)    Constitutional: NAD, calm, comfortable Vitals:   12/04/17 1532  BP: 118/72  Pulse: 85  Resp: 16  Temp: 98.1 F (36.7 C)  TempSrc: Oral  SpO2: 100%  Weight: 54.4 kg (120 lb)  Height: '5\' 5"'$  (1.651 m)   Eyes: lids and conjunctivae normal ENMT: Mucous membranes are moist.  Neck: normal, supple Respiratory: clear to auscultation bilaterally. Normal respiratory effort. No accessory muscle use.  Cardiovascular: Regular rate and rhythm, no murmurs. No extremity edema. Abdomen: no tenderness, no distention. Bowel sounds positive.  Musculoskeletal: Right knee with swelling and significant tenderness to palpation.  No palpable effusion currently noted on my exam.  Unable to flex knee due to severe pain. Skin: no rashes, lesions, ulcers.  Erythema noted over right knee. Psychiatric: Normal judgment and insight. Alert and oriented x 3. Normal mood.   Labs  on Admission: I have personally reviewed following labs and imaging studies  CBC: Recent Labs  Lab 12/04/17 1547  WBC 15.7*  NEUTROABS 13.1*  HGB 13.4  HCT 41.7  MCV 93.5  PLT 756   Basic Metabolic Panel: Recent Labs  Lab 12/04/17 1547  NA 135  K 3.4*  CL 98*  CO2 25  GLUCOSE 182*  BUN 11  CREATININE 0.72  CALCIUM 9.3   GFR: Estimated Creatinine Clearance: 84.3 mL/min (by C-G formula based on SCr of 0.72 mg/dL). Liver Function Tests: Recent Labs  Lab  12/04/17 1547  AST 21  ALT 14  ALKPHOS 75  BILITOT 0.8  PROT 7.3  ALBUMIN 3.8   No results for input(s): LIPASE, AMYLASE in the last 168 hours. No results for input(s): AMMONIA in the last 168 hours. Coagulation Profile: No results for input(s): INR, PROTIME in the last 168 hours. Cardiac Enzymes: No results for input(s): CKTOTAL, CKMB, CKMBINDEX, TROPONINI in the last 168 hours. BNP (last 3 results) No results for input(s): PROBNP in the last 8760 hours. HbA1C: No results for input(s): HGBA1C in the last 72 hours. CBG: No results for input(s): GLUCAP in the last 168 hours. Lipid Profile: No results for input(s): CHOL, HDL, LDLCALC, TRIG, CHOLHDL, LDLDIRECT in the last 72 hours. Thyroid Function Tests: No results for input(s): TSH, T4TOTAL, FREET4, T3FREE, THYROIDAB in the last 72 hours. Anemia Panel: No results for input(s): VITAMINB12, FOLATE, FERRITIN, TIBC, IRON, RETICCTPCT in the last 72 hours. Urine analysis: No results found for: COLORURINE, APPEARANCEUR, LABSPEC, PHURINE, GLUCOSEU, HGBUR, BILIRUBINUR, KETONESUR, PROTEINUR, UROBILINOGEN, NITRITE, LEUKOCYTESUR  Radiological Exams on Admission: Dg Knee Complete 4 Views Right  Result Date: 12/04/2017 CLINICAL DATA:  Fall with knee pain EXAM: RIGHT KNEE - COMPLETE 4+ VIEW COMPARISON:  None. FINDINGS: No evidence of fracture or dislocation. Small suprapatellar joint effusion. No evidence of arthropathy or other focal bone abnormality. Soft tissues are unremarkable. IMPRESSION: Small suprapatellar effusion.  No fracture or dislocation. Electronically Signed   By: Ulyses Jarred M.D.   On: 12/04/2017 16:16    Assessment/Plan Principal Problem:   Prepatellar bursitis Active Problems:   Tobacco abuse    1. Prepatellar bursitis.  This is in the setting of abrasion and trauma to her knee from the previous evening.  We will continue Ancef and clindamycin as ordered empirically.  She has received a Tdap shot and has not  unfortunately had blood cultures collected.  I believe at this point that they would be nondiagnostic especially with initiation of IV antibiotics, but will obtain just in case.  Consultation to orthopedics in a.m. for evaluation and management of this effusion.  She does not have an urgent need for evaluation as there appears to be no signs of compartment syndrome with good distal pulses noted and no sign of paresthesias or pallor.  Continue pain management with morphine as needed.  N.p.o. after midnight in case of procedure, otherwise maintain on regular diet until then.  PT evaluation.  Fall precautions. Obtain ESR and CRP. 2. Mild hypokalemia.  Replete orally and recheck in a.m. 3. Mild hyperglycemia.  Check A1c and place on sliding scale insulin. 4. Tobacco abuse.  Patient smokes about 2 packs/day.  Place on nicotine patch.   DVT prophylaxis: Lovenox Code Status: Full Family Communication: Friend at bedside Disposition Plan:IV antibiotics with ortho consultation; pain management. To home when pain controlled and ambulating. Consults called:Orthopedics Admission status: Obs, med-surg   Giovanne Nickolson Darleen Crocker DO [facility] Pager [phone]  If  7PM-7AM, please contact night-coverage [email or web address] Password [facility]  12/04/2017, 6:30 PM

## 2017-12-04 NOTE — Progress Notes (Signed)
Pharmacy Antibiotic Note  Garvin Filaabitha M Gibbon is a 37 y.o. female admitted on 12/04/2017 with wound infection (prepatellar bursitis).  Pharmacy has been consulted for cefazolin  dosing.  Plan: Cefazolin 1gm IV q8h Also on clindamycin 600mg  IV q8h F/U cxs and clinical progress Monitor V/S, labs  Height: 5\' 5"  (165.1 cm) Weight: 120 lb (54.4 kg) IBW/kg (Calculated) : 57  Temp (24hrs), Avg:98.1 F (36.7 C), Min:98 F (36.7 C), Max:98.1 F (36.7 C)  Recent Labs  Lab 12/04/17 1547  WBC 15.7*  CREATININE 0.72    Estimated Creatinine Clearance: 84.3 mL/min (by C-G formula based on SCr of 0.72 mg/dL).    No Known Allergies  Antimicrobials this admission: Cefazolin 3/20 >>  Clindamycin 3/20>>  Dose adjustments this admission: N/A  Microbiology results: 3/20 BCx: pending  Thank you for allowing pharmacy to be a part of this patient's care.  Elder CyphersLorie Hadar Elgersma, BS Pharm D, [place] Clinical Pharmacist Pager [phone] 12/04/2017 7:50 PM

## 2017-12-04 NOTE — ED Provider Notes (Signed)
[facility] EMERGENCY DEPARTMENT Provider Note   CSN: [id number] Arrival date & time: 12/04/17  1453     History   Chief Complaint Chief Complaint  Patient presents with  . Leg Pain    HPI Suzanne Sandoval is a 37 y.o. female.  Patient is a 37 year old female who presents to the emergency department with a complaint of right leg pain.  Patient states that she was cleaning up the house on yesterday.  She was ripping up carpet that was very dirty.  She thinks that she cut her knee on some staples that was under the older layers of carpet.  Today she noticed swelling and redness.  She has problem with movement.  She has problem with applying weight to the right lower extremity.  During the night last night she felt as though she was having temperature elevation with chills.  She did not measure her temperature elevation at that time.  A few hours before her arrival to the emergency department she took 2 Tylenol to assist with this.  She says it helped with the chills but it did not do anything for the pain.  The patient denies being a diabetic.  She denies any medical issues that would cause an issue with her immune system.  She has not had any previous operations or procedures or traumas to the right lower extremity.  She presents now for assistance with this issue.   The history is provided by the patient.  Leg Pain      History reviewed. No pertinent past medical history.  There are no active problems to display for this patient.   Past Surgical History:  Procedure Laterality Date  . APPENDECTOMY    . TUBAL LIGATION      OB History    No data available       Home Medications    Prior to Admission medications   Medication Sig Start Date End Date Taking? Authorizing Provider  amoxicillin (AMOXIL) 500 MG capsule 2 po bid with food 10/07/11   Ivery Quale, PA-C  HYDROcodone-acetaminophen (NORCO/VICODIN) 5-325 MG tablet Take 2 tablets by mouth every 4 (four) hours as  needed. 04/30/17   Elson Areas, PA-C  ibuprofen (ADVIL,MOTRIN) 600 MG tablet Take 1 tablet (600 mg total) by mouth every 6 (six) hours as needed. 04/30/17   Elson Areas, PA-C    Family History History reviewed. No pertinent family history.  Social History Social History   Tobacco Use  . Smoking status: Current Every Day Smoker    Packs/day: 1.00    Types: Cigarettes  . Smokeless tobacco: Never Used  Substance Use Topics  . Alcohol use: No  . Drug use: Yes    Types: Marijuana, Cocaine    Comment: pills (4-5 days ago)     Allergies   Patient has no known allergies.   Review of Systems Review of Systems  Constitutional: Positive for chills. Negative for activity change.       All ROS Neg except as noted in HPI  HENT: Negative for nosebleeds.   Eyes: Negative for photophobia and discharge.  Respiratory: Negative for cough, shortness of breath and wheezing.   Cardiovascular: Negative for chest pain and palpitations.  Gastrointestinal: Negative for abdominal pain and blood in stool.  Genitourinary: Negative for dysuria, frequency and hematuria.  Musculoskeletal: Positive for arthralgias. Negative for back pain and neck pain.       Right knee pain  Skin: Negative.   Neurological: Negative for  dizziness, seizures and speech difficulty.  Psychiatric/Behavioral: Negative for confusion and hallucinations.     Physical Exam Updated Vital Signs BP 118/72 (BP Location: Right Arm)   Pulse 85   Temp 98.1 F (36.7 C) (Oral)   Resp 16   Ht 5\' 5"  (1.651 m)   Wt 54.4 kg (120 lb)   LMP 10/23/2017   SpO2 100%   BMI 19.97 kg/m   Physical Exam  Constitutional: She is oriented to person, place, and time. She appears well-developed and well-nourished.  Non-toxic appearance.  HENT:  Head: Normocephalic.  Right Ear: Tympanic membrane and external ear normal.  Left Ear: Tympanic membrane and external ear normal.  Eyes: EOM and lids are normal. Pupils are equal, round, and  reactive to light.  Neck: Normal range of motion. Neck supple. Carotid bruit is not present.  Cardiovascular: Normal rate, regular rhythm, normal heart sounds, intact distal pulses and normal pulses.  Pulmonary/Chest: Breath sounds normal. No respiratory distress.  Abdominal: Soft. Bowel sounds are normal. There is no tenderness. There is no guarding.  Musculoskeletal: Normal range of motion.  There is full range of motion of the toes of the right lower extremity.  Full range of motion of the ankle.  There is increased warmth from the mid tibial area to the mid thigh.  The right knee is extremely tender to even light palpation.  There is increased redness, warmth, and swelling over the anterior right knee.  There is a scabbed wound anterior medial to the anterior tibial tuberosity.  The patient is uncooperative for full examination but there appears to be an effusion present.  There are a few palpable inguinal nodes also present on the right.  Lymphadenopathy:       Head (right side): No submandibular adenopathy present.       Head (left side): No submandibular adenopathy present.    She has no cervical adenopathy.  Neurological: She is alert and oriented to person, place, and time. She has normal strength. No cranial nerve deficit or sensory deficit.  Skin: Skin is warm and dry.  Psychiatric: She has a normal mood and affect. Her speech is normal.  Nursing note and vitals reviewed.    ED Treatments / Results  Labs (all labs ordered are listed, but only abnormal results are displayed) Labs Reviewed  CBC WITH DIFFERENTIAL/PLATELET - Abnormal; Notable for the following components:      Result Value   WBC 15.7 (*)    Neutro Abs 13.1 (*)    All other components within normal limits  COMPREHENSIVE METABOLIC PANEL - Abnormal; Notable for the following components:   Potassium 3.4 (*)    Chloride 98 (*)    Glucose, Bld 182 (*)    All other components within normal limits    EKG  EKG  Interpretation None       Radiology Dg Knee Complete 4 Views Right  Result Date: 12/04/2017 CLINICAL DATA:  Fall with knee pain EXAM: RIGHT KNEE - COMPLETE 4+ VIEW COMPARISON:  None. FINDINGS: No evidence of fracture or dislocation. Small suprapatellar joint effusion. No evidence of arthropathy or other focal bone abnormality. Soft tissues are unremarkable. IMPRESSION: Small suprapatellar effusion.  No fracture or dislocation. Electronically Signed   By: Deatra Robinson M.D.   On: 12/04/2017 16:16    Procedures Procedures (including critical care time)  Medications Ordered in ED Medications  ceFAZolin (ANCEF) IVPB 1 g/50 mL premix (not administered)  clindamycin (CLEOCIN) IVPB 600 mg (not administered)  HYDROmorphone (  DILAUDID) injection 1 mg (not administered)  prochlorperazine (COMPAZINE) injection 5 mg (not administered)  0.9 %  sodium chloride infusion (not administered)     Initial Impression / Assessment and Plan / ED Course  I have reviewed the triage vital signs and the nursing notes.  Pertinent labs & imaging results that were available during my care of the patient were reviewed by me and considered in my medical decision making (see chart for details).       Final Clinical Impressions(s) / ED Diagnoses  MDM  Vital signs within normal limits.  Pulse oximetry is 100% on room air.  Within normal limits by my interpretation.  The patient has cellulitis involving the right knee and right lower extremity.  X-ray of the right knee is negative for fracture or dislocation.  No evidence of foreign body noted.  There is a small suprapatellar joint effusion present.  The complete blood count shows the white blood cells to be elevated at 10,700.  There is no noted shift to the left.  Comprehensive metabolic panel shows the potassium to be slightly low at 3.4, the glucose is elevated at 182, otherwise the chemistries are within normal limits.  Anion gap is normal at  12.  Patient started on Ancef and clindamycin.  Patient given IV medication for pain.  Patient tolerating antibiotics without problem.  Pain improving after IV pain medication.  Case discussed with Dr. Juleen ChinaKohut Case discussed with [facility].  Patient to be admitted to the hospital with cellulitis and prepatellar effusion.   Final diagnoses:  Cellulitis of right leg  Prepatellar effusion of right knee    ED Discharge Orders    None       Ivery QualeBryant, Wolfe Camarena, PA-C 12/04/17 1914    Raeford RazorKohut, Stephen, MD 12/05/17 [phone]

## 2017-12-04 NOTE — ED Triage Notes (Signed)
PT states she hit her right leg/knee taking up carpet and cleaning up a house. Cut obtained to right knee by staples under the old carpet per pt. Leg noted to have swelling and redness and hurting with any movement with cold chills. PT states she took 2 tylenol a few hours before ED arrival.

## 2017-12-05 ENCOUNTER — Encounter (HOSPITAL_COMMUNITY): Payer: Self-pay | Admitting: Family Medicine

## 2017-12-05 ENCOUNTER — Inpatient Hospital Stay (HOSPITAL_COMMUNITY): Payer: Self-pay

## 2017-12-05 DIAGNOSIS — L03115 Cellulitis of right lower limb: Secondary | ICD-10-CM | POA: Diagnosis present

## 2017-12-05 DIAGNOSIS — M71161 Other infective bursitis, right knee: Secondary | ICD-10-CM

## 2017-12-05 LAB — BASIC METABOLIC PANEL
ANION GAP: 9 (ref 5–15)
BUN: 9 mg/dL (ref 6–20)
CO2: 22 mmol/L (ref 22–32)
Calcium: 8.1 mg/dL — ABNORMAL LOW (ref 8.9–10.3)
Chloride: 103 mmol/L (ref 101–111)
Creatinine, Ser: 0.63 mg/dL (ref 0.44–1.00)
Glucose, Bld: 128 mg/dL — ABNORMAL HIGH (ref 65–99)
POTASSIUM: 3.4 mmol/L — AB (ref 3.5–5.1)
Sodium: 134 mmol/L — ABNORMAL LOW (ref 135–145)

## 2017-12-05 LAB — CBC
HEMATOCRIT: 37.3 % (ref 36.0–46.0)
Hemoglobin: 12 g/dL (ref 12.0–15.0)
MCH: 29.9 pg (ref 26.0–34.0)
MCHC: 32.2 g/dL (ref 30.0–36.0)
MCV: 93 fL (ref 78.0–100.0)
Platelets: 292 10*3/uL (ref 150–400)
RBC: 4.01 MIL/uL (ref 3.87–5.11)
RDW: 13.6 % (ref 11.5–15.5)
WBC: 12.8 10*3/uL — AB (ref 4.0–10.5)

## 2017-12-05 LAB — HEMOGLOBIN A1C
Hgb A1c MFr Bld: 5.1 % (ref 4.8–5.6)
MEAN PLASMA GLUCOSE: 99.67 mg/dL

## 2017-12-05 LAB — GLUCOSE, CAPILLARY
GLUCOSE-CAPILLARY: 234 mg/dL — AB (ref 65–99)
Glucose-Capillary: 208 mg/dL — ABNORMAL HIGH (ref 65–99)
Glucose-Capillary: 101 mg/dL — ABNORMAL HIGH (ref 65–99)
Glucose-Capillary: 113 mg/dL — ABNORMAL HIGH (ref 65–99)

## 2017-12-05 LAB — C-REACTIVE PROTEIN: CRP: 11.3 mg/dL — AB (ref <1.0)

## 2017-12-05 MED ORDER — VANCOMYCIN HCL IN DEXTROSE 1-5 GM/200ML-% IV SOLN
1000.0000 mg | Freq: Once | INTRAVENOUS | Status: AC
  Administered 2017-12-05: 1000 mg via INTRAVENOUS
  Filled 2017-12-05: qty 200

## 2017-12-05 MED ORDER — VANCOMYCIN HCL IN DEXTROSE 750-5 MG/150ML-% IV SOLN
750.0000 mg | Freq: Two times a day (BID) | INTRAVENOUS | Status: DC
Start: 2017-12-06 — End: 2017-12-07
  Administered 2017-12-06 – 2017-12-07 (×3): 750 mg via INTRAVENOUS
  Filled 2017-12-05 (×7): qty 150

## 2017-12-05 MED ORDER — POTASSIUM CHLORIDE CRYS ER 20 MEQ PO TBCR
40.0000 meq | EXTENDED_RELEASE_TABLET | Freq: Once | ORAL | Status: AC
  Administered 2017-12-05: 40 meq via ORAL
  Filled 2017-12-05: qty 2

## 2017-12-05 MED ORDER — POTASSIUM CHLORIDE 10 MEQ/100ML IV SOLN
10.0000 meq | INTRAVENOUS | Status: DC
  Administered 2017-12-05: 10 meq via INTRAVENOUS
  Filled 2017-12-05: qty 100

## 2017-12-05 MED ORDER — INSULIN ASPART 100 UNIT/ML ~~LOC~~ SOLN
0.0000 [IU] | Freq: Four times a day (QID) | SUBCUTANEOUS | Status: DC
  Administered 2017-12-06: 1 [IU] via SUBCUTANEOUS
  Administered 2017-12-06 (×2): 2 [IU] via SUBCUTANEOUS
  Administered 2017-12-07: 5 [IU] via SUBCUTANEOUS
  Administered 2017-12-07: 1 [IU] via SUBCUTANEOUS

## 2017-12-05 MED ORDER — DEXTROSE 5 % IV SOLN
1.0000 g | Freq: Three times a day (TID) | INTRAVENOUS | Status: DC
  Administered 2017-12-05: 1 g via INTRAVENOUS
  Filled 2017-12-05 (×4): qty 10

## 2017-12-05 NOTE — Plan of Care (Signed)
  Problem: Acute Rehab PT Goals(only PT should resolve) Goal: Pt Will Go Supine/Side To Sit Outcome: Progressing Flowsheets (Taken 12/05/2017 1123) Pt will go Supine/Side to Sit: with supervision Goal: Patient Will Transfer Sit To/From Stand Outcome: Progressing Flowsheets (Taken 12/05/2017 1123) Patient will transfer sit to/from stand: with supervision Goal: Pt Will Transfer Bed To Chair/Chair To Bed Outcome: Progressing Flowsheets (Taken 12/05/2017 1123) Pt will Transfer Bed to Chair/Chair to Bed: with supervision Goal: Pt Will Ambulate Outcome: Progressing Flowsheets (Taken 12/05/2017 1123) Pt will Ambulate: 50 feet;with min guard assist;with rolling walker  11:24 AM, 12/05/17 Ocie BobJames Meryle Pugmire, MPT Physical Therapist with [facility] [phone] office [phone] mobile phone

## 2017-12-05 NOTE — Progress Notes (Addendum)
PROGRESS NOTE    Suzanne Sandoval  ZOX:[id number]  DOB: 07/29/1981  DOA: 12/04/2017 PCP: Patient, No Pcp Per   Brief Admission Hx: Suzanne Sandoval is a 37 y.o. female with medical history significant for going tobacco abuse who presented to the emergency department after she woke up this morning with severe pain and swelling to her right knee.  She noted some erythema, but no drainage from an abrasion from the previous night where she was ripping up carpets.  She apparently received a cut on her knee from a staple in the carpeting.  The pain is severe enough to where she cannot weight-bear.  She also cannot flex her knee due to the swelling.  She seems to think that she had an episode of fever and chills overnight.  She denies any chest pain, shortness of breath, nausea, vomiting, or diarrhea.   MDM/Assessment & Plan:   1. Prepatellar bursitis with cellulitis of right knee - infection has been spreading despite antibiotics.  WBC is coming down which is encouraging.  No fever reported.  Blood cultures no growth.   I spoke with orthopedics who will be consulting.  Continue antibiotics for now.  Tdap has been given this admission.  2. Hypokalemia - repleted orally.  3. Hyperglycemia - Pt does not have a history of known DM. However some BS have been near 250.  A1c is still pending.  Managed now with sliding scale.   4. Tobacco and recreational drug abuse - counseled, nicotine patch offered.    DVT prophylaxis: lovenox Code Status: Full  Family Communication: friend  Disposition Plan: continue IV antibiotics, ortho consult   Consultants:  orthopedics  Subjective: Pt says that the pain has been severe and the heat and redness has been expanding and the swelling is getting worse.  Objective: Vitals:   12/04/17 2001 12/04/17 2057 12/04/17 2100 12/05/17 0500  BP: 98/60  (!) 94/57 100/80  Pulse: 82  95 90  Resp: 17  20 20   Temp: 97.8 F (36.6 C)  97.8 F (36.6 C) 98 F (36.7 C)    TempSrc:   Oral Oral  SpO2: 98% 98% 100% 100%  Weight:   54.4 kg (120 lb) 54.4 kg (120 lb)  Height:   5\' 5"  (1.651 m)     Intake/Output Summary (Last 24 hours) at 12/05/2017 1250 Last data filed at 12/05/2017 1104 Gross per 24 hour  Intake 1003.75 ml  Output -  Net 1003.75 ml   Filed Weights   12/04/17 1532 12/04/17 2100 12/05/17 0500  Weight: 54.4 kg (120 lb) 54.4 kg (120 lb) 54.4 kg (120 lb)     REVIEW OF SYSTEMS  As per history otherwise all reviewed and reported negative  Exam:  General exam: thin female, awake, alert, cooperative.  Respiratory system: Clear. No increased work of breathing. Cardiovascular system: S1 & S2 heard, RRR. No JVD, murmurs, gallops, clicks or pedal edema. Gastrointestinal system: Abdomen is nondistended, soft and nontender. Normal bowel sounds heard. Central nervous system: Alert and oriented. No focal neurological deficits. Extremities: right knee and leg with swollen knee with healing sore on patellar surface surrounding erythema extending proximally and distally, hot to touch.     Data Reviewed: Basic Metabolic Panel: Recent Labs  Lab 12/04/17 1547 12/05/17 0606  NA 135 134*  K 3.4* 3.4*  CL 98* 103  CO2 25 22  GLUCOSE 182* 128*  BUN 11 9  CREATININE 0.72 0.63  CALCIUM 9.3 8.1*   Liver Function Tests:  Recent Labs  Lab 12/04/17 1547  AST 21  ALT 14  ALKPHOS 75  BILITOT 0.8  PROT 7.3  ALBUMIN 3.8   No results for input(s): LIPASE, AMYLASE in the last 168 hours. No results for input(s): AMMONIA in the last 168 hours. CBC: Recent Labs  Lab 12/04/17 1547 12/05/17 0606  WBC 15.7* 12.8*  NEUTROABS 13.1*  --   HGB 13.4 12.0  HCT 41.7 37.3  MCV 93.5 93.0  PLT 316 292   Cardiac Enzymes: No results for input(s): CKTOTAL, CKMB, CKMBINDEX, TROPONINI in the last 168 hours. CBG (last 3)  Recent Labs    12/05/17 0435 12/05/17 0726 12/05/17 1135  GLUCAP 208* 234* 101*   Recent Results (from the past 240 hour(s))   Culture, blood (routine x 2)     Status: None (Preliminary result)   Collection Time: 12/04/17  7:09 PM  Result Value Ref Range Status   Specimen Description LEFT ANTECUBITAL  Final   Special Requests   Final    BOTTLES DRAWN AEROBIC AND ANAEROBIC Blood Culture adequate volume   Culture   Final    NO GROWTH < 24 HOURS Performed at [facility], [address] [place], [place] [address]    Report Status PENDING  Incomplete  Culture, blood (routine x 2)     Status: None (Preliminary result)   Collection Time: 12/04/17  7:09 PM  Result Value Ref Range Status   Specimen Description RIGHT ANTECUBITAL  Final   Special Requests   Final    BOTTLES DRAWN AEROBIC AND ANAEROBIC Blood Culture adequate volume   Culture   Final    NO GROWTH < 24 HOURS Performed at [facility], [address] [place], [place] [address]    Report Status PENDING  Incomplete     Studies: Dg Knee Complete 4 Views Right  Result Date: 12/04/2017 CLINICAL DATA:  Fall with knee pain EXAM: RIGHT KNEE - COMPLETE 4+ VIEW COMPARISON:  None. FINDINGS: No evidence of fracture or dislocation. Small suprapatellar joint effusion. No evidence of arthropathy or other focal bone abnormality. Soft tissues are unremarkable. IMPRESSION: Small suprapatellar effusion.  No fracture or dislocation. Electronically Signed   By: Deatra RobinsonKevin  Herman M.D.   On: 12/04/2017 16:16     Scheduled Meds: . enoxaparin (LOVENOX) injection  40 mg Subcutaneous Q24H  . insulin aspart  0-9 Units Subcutaneous Q4H  . nicotine  14 mg Transdermal Daily   Continuous Infusions: .  ceFAZolin (ANCEF) IV Stopped (12/05/17 1104)  . clindamycin (CLEOCIN) IV 600 mg (12/05/17 0550)    Principal Problem:   Prepatellar bursitis Active Problems:   Tobacco abuse   Cellulitis of right knee   Time spent:   Standley Dakinslanford Johnson, MD, FAAFP [facility] Pager [phone]  If 7PM-7AM, please contact night-coverage [email or web address] Password [email or web address] 12/05/2017, 12:50  PM    LOS: 0 days

## 2017-12-05 NOTE — Progress Notes (Signed)
Pharmacy Antibiotic Note  Suzanne Sandoval is a 37 y.o. female admitted on 12/04/2017 with wound infection (prepatellar bursitis).  Pharmacy has been consulted for Vancomycin dosing.  Plan: Vancomycin 1000mg  IV x 1 now then 750mg  IV q12hrs Check trough at steady state F/U cxs and clinical progress Monitor V/S, labs  Height: 5\' 5"  (165.1 cm) Weight: 120 lb (54.4 kg) IBW/kg (Calculated) : 57  Temp (24hrs), Avg:97.9 F (36.6 C), Min:97.8 F (36.6 C), Max:98.1 F (36.7 C)  Recent Labs  Lab 12/04/17 1547 12/05/17 0606  WBC 15.7* 12.8*  CREATININE 0.72 0.63    Estimated Creatinine Clearance: 84.3 mL/min (by C-G formula based on SCr of 0.63 mg/dL).    No Known Allergies  Antimicrobials this admission: Cefazolin 3/20 >> 3/21 Clindamycin 3/20>>3/21 Vancomycin 3/21 >>  Dose adjustments this admission: N/A  Microbiology results: 3/20 BCx: pending  Thank you for allowing pharmacy to be a part of this patient's care.  Valrie HartScott Dawanna Grauberger, PharmD Clinical Pharmacist Pager:  [phone] 12/05/2017   12/05/2017 2:21 PM

## 2017-12-05 NOTE — Consult Note (Signed)
Reason for Consult: Right knee pain swelling Referring Physician: Dr. Lenoria Chime  Suzanne Sandoval is an 37 y.o. female.  HPI: 37 year old female presents to the hospital complaining of right knee pain and swelling.  Apparently she was placing some carpet at her home developed pain and swelling in her knee 2 days ago came to the hospital with a presumed prepatellar bursitis which was infected.  She was started on IV antibiotics.  Despite Ancef and clindamycin IV the area seemed to enlarge in the subcutaneous tissue and worsen over the actual site of abrasion to include purulent drainage which was cultured.  B  I have been asked to consult on her regarding further management and treatment  (She complains of severe aching anterior right knee pain now for 2 days associated with swelling redness drainage.)  Medical history denied hypertension diabetes  Past Surgical History:  Procedure Laterality Date  . APPENDECTOMY    . TUBAL LIGATION      History reviewed. No pertinent family history.  No family history reported of any serious medical disease  Social History:  reports that she has been smoking cigarettes.  She has been smoking about 1.00 pack per day. She has never used smokeless tobacco. She reports that she has current or past drug history. Drugs: Marijuana and Cocaine. She reports that she does not drink alcohol.  Allergies: No Known Allergies  Medications: I have reviewed the patient's current medications.  Results for orders placed or performed during the hospital encounter of 12/04/17 (from the past 48 hour(s))  CBC with Differential     Status: Abnormal   Collection Time: 12/04/17  3:47 PM  Result Value Ref Range   WBC 15.7 (H) 4.0 - 10.5 K/uL   RBC 4.46 3.87 - 5.11 MIL/uL   Hemoglobin 13.4 12.0 - 15.0 g/dL   HCT 41.7 36.0 - 46.0 %   MCV 93.5 78.0 - 100.0 fL   MCH 30.0 26.0 - 34.0 pg   MCHC 32.1 30.0 - 36.0 g/dL   RDW 13.4 11.5 - 15.5 %   Platelets 316 150 - 400 K/uL    Neutrophils Relative % 83 %   Neutro Abs 13.1 (H) 1.7 - 7.7 K/uL   Lymphocytes Relative 12 %   Lymphs Abs 1.8 0.7 - 4.0 K/uL   Monocytes Relative 5 %   Monocytes Absolute 0.7 0.1 - 1.0 K/uL   Eosinophils Relative 0 %   Eosinophils Absolute 0.0 0.0 - 0.7 K/uL   Basophils Relative 0 %   Basophils Absolute 0.0 0.0 - 0.1 K/uL    Comment: Performed at [facility], [address], [place], [place] [address]  Comprehensive metabolic panel     Status: Abnormal   Collection Time: 12/04/17  3:47 PM  Result Value Ref Range   Sodium 135 135 - 145 mmol/L   Potassium 3.4 (L) 3.5 - 5.1 mmol/L   Chloride 98 (L) 101 - 111 mmol/L   CO2 25 22 - 32 mmol/L   Glucose, Bld 182 (H) 65 - 99 mg/dL   BUN 11 6 - 20 mg/dL   Creatinine, Ser 0.72 0.44 - 1.00 mg/dL   Calcium 9.3 8.9 - 10.3 mg/dL   Total Protein 7.3 6.5 - 8.1 g/dL   Albumin 3.8 3.5 - 5.0 g/dL   AST 21 15 - 41 U/L   ALT 14 14 - 54 U/L   Alkaline Phosphatase 75 38 - 126 U/L   Total Bilirubin 0.8 0.3 - 1.2 mg/dL   GFR  calc non Af Amer >60 >60 mL/min   GFR calc Af Amer >60 >60 mL/min    Comment: (NOTE) The eGFR has been calculated using the CKD EPI equation. This calculation has not been validated in all clinical situations. eGFR's persistently <60 mL/min signify possible Chronic Kidney Disease.    Anion gap 12 5 - 15    Comment: Performed at [facility] [address] [place], [place] [address]  Hemoglobin A1c     Status: None   Collection Time: 12/04/17  7:00 PM  Result Value Ref Range   Hgb A1c MFr Bld 5.1 4.8 - 5.6 %    Comment: (NOTE) Pre diabetes:          5.7%-6.4% Diabetes:              >6.4% Glycemic control for   <7.0% adults with diabetes    Mean Plasma Glucose 99.67 mg/dL    Comment: Performed at [facility] [address], [place], [place] [address]  C-reactive protein     Status: Abnormal   Collection Time: 12/04/17  7:00 PM  Result Value Ref Range   CRP 11.3 (H) <1.0 mg/dL    Comment: Performed at [facility], [place] [address] [place], [place] [address]  Culture, blood (routine x 2)     Status: None (Preliminary result)   Collection Time: 12/04/17  7:09 PM  Result Value Ref Range   Specimen Description LEFT ANTECUBITAL    Special Requests      BOTTLES DRAWN AEROBIC AND ANAEROBIC Blood Culture adequate volume   Culture      NO GROWTH < 24 HOURS Performed at [facility], [address], [place], [place] [address]    Report Status PENDING   Culture, blood (routine x 2)     Status: None (Preliminary result)   Collection Time: 12/04/17  7:09 PM  Result Value Ref Range   Specimen Description RIGHT ANTECUBITAL    Special Requests      BOTTLES DRAWN AEROBIC AND ANAEROBIC Blood Culture adequate volume   Culture      NO GROWTH < 24 HOURS Performed at [facility], [address] [place], [place] [address]    Report Status PENDING   Sedimentation rate     Status: None   Collection Time: 12/04/17  7:09 PM  Result Value Ref Range   Sed Rate 20 0 - 22 mm/hr    Comment: Performed at [facility], [address] [place], [place] [address]  Glucose, capillary     Status: Abnormal   Collection Time: 12/04/17  9:53 PM  Result Value Ref Range   Glucose-Capillary 155 (H) 65 - 99 mg/dL   Comment 1 Notify RN    Comment 2 Document in Chart   Glucose, capillary     Status: Abnormal   Collection Time: 12/05/17  4:35 AM  Result Value Ref Range   Glucose-Capillary 208 (H) 65 - 99 mg/dL  Basic metabolic panel     Status: Abnormal   Collection Time: 12/05/17  6:06 AM  Result Value Ref Range   Sodium 134 (L) 135 - 145 mmol/L   Potassium 3.4 (L) 3.5 - 5.1 mmol/L   Chloride 103 101 - 111 mmol/L   CO2 22 22 - 32 mmol/L   Glucose, Bld 128 (H) 65 - 99 mg/dL   BUN 9 6 - 20 mg/dL   Creatinine, Ser 0.63 0.44 - 1.00 mg/dL   Calcium 8.1 (L) 8.9 -  10.3 mg/dL   GFR calc non Af Amer >60 >60 mL/min   GFR calc Af Amer >60 >60 mL/min    Comment: (NOTE) The eGFR has been calculated using the CKD EPI  equation. This calculation has not been validated in all clinical situations. eGFR's persistently <60 mL/min signify possible Chronic Kidney Disease.    Anion gap 9 5 - 15    Comment: Performed at [facility], [address], [place], [place] [address]  CBC     Status: Abnormal   Collection Time: 12/05/17  6:06 AM  Result Value Ref Range   WBC 12.8 (H) 4.0 - 10.5 K/uL   RBC 4.01 3.87 - 5.11 MIL/uL   Hemoglobin 12.0 12.0 - 15.0 g/dL   HCT 37.3 36.0 - 46.0 %   MCV 93.0 78.0 - 100.0 fL   MCH 29.9 26.0 - 34.0 pg   MCHC 32.2 30.0 - 36.0 g/dL   RDW 13.6 11.5 - 15.5 %   Platelets 292 150 - 400 K/uL    Comment: Performed at [facility], [address], [place], [place] [address]  Glucose, capillary     Status: Abnormal   Collection Time: 12/05/17  7:26 AM  Result Value Ref Range   Glucose-Capillary 234 (H) 65 - 99 mg/dL   Comment 1 Notify RN    Comment 2 Document in Chart   Glucose, capillary     Status: Abnormal   Collection Time: 12/05/17 11:35 AM  Result Value Ref Range   Glucose-Capillary 101 (H) 65 - 99 mg/dL   Comment 1 Notify RN    Comment 2 Document in Chart   Glucose, capillary     Status: Abnormal   Collection Time: 12/05/17  4:41 PM  Result Value Ref Range   Glucose-Capillary 113 (H) 65 - 99 mg/dL   Comment 1 Notify RN    Comment 2 Document in Chart     Dg Knee Complete 4 Views Right  Result Date: 12/04/2017 CLINICAL DATA:  Fall with knee pain EXAM: RIGHT KNEE - COMPLETE 4+ VIEW COMPARISON:  None. FINDINGS: No evidence of fracture or dislocation. Small suprapatellar joint effusion. No evidence of arthropathy or other focal bone abnormality. Soft tissues are unremarkable. IMPRESSION: Small suprapatellar effusion.  No fracture or dislocation. Electronically Signed   By: Ulyses Jarred M.D.   On: 12/04/2017 16:16    Review of Systems  Constitutional: Negative for chills, fever, malaise/fatigue and weight loss.  Musculoskeletal: Positive for joint pain. Negative for  myalgias.  Skin:       Abrasion right knee  Neurological: Negative.   All other systems reviewed and are negative.  Blood pressure 100/80, pulse 90, temperature 98 F (36.7 C), temperature source Oral, resp. rate 20, height _0  (1.651 m), weight 120 lb (54.4 kg), last menstrual period 10/23/2017, SpO2 95 %. Physical Exam  Constitutional: She is oriented to person, place, and time. She appears well-nourished.  Eyes: Right eye exhibits no discharge. Left eye exhibits no discharge. No scleral icterus.  Neck: Neck supple. No JVD present. No tracheal deviation present.  Cardiovascular: Intact distal pulses.  Respiratory: Effort normal. No stridor.  GI: Soft. She exhibits no distension.  Neurological: She is alert and oriented to person, place, and time. She has normal reflexes. She exhibits normal muscle tone. Coordination normal.  Skin: Skin is warm and dry. No rash noted. No erythema. No pallor.  Psychiatric: She has a normal mood and affect. Her behavior is normal. Thought content normal.  Inspection  right knee at the prepatellar bursa there is purulent drainage and a large coalescence of erythema there is less intense erythema proximally 3 cm to 4 cm above the patella and distally to the mid shin.  The patient's knee range of motion right now is 0 degrees held in slight flexion.  She is tender to palpation in the shin just proximal to the patella as well as the patella at the coalescence of erythema.  Stability test had to be deferred because of the pain muscle tone was normal skin was red and erythematous primarily over the abrasion area near the prepatellar bursal sac and around it and then less diffuse erythema extending as stated.  Distal pulses were intact she had no lymphadenopathy or streaking of the lymph system she had normal sensation in the limb.  Assessment/Plan: Prepatellar bursitis with infection including cellulitis right leg  Possible joint septic arthritis right  knee  Recommend vancomycin and MRI to evaluate the joint to rule out any fluid in the joint and if no improvement after vancomycin incision and drainage of the prepatellar bursa    Arther Abbott 12/05/2017, 5:07 PM

## 2017-12-05 NOTE — Evaluation (Signed)
Physical Therapy Evaluation Patient Details Name: Suzanne Sandoval MRN: [id number] DOB: 12/22/1980 Today's Date: 12/05/2017   History of Present Illness  Suzanne Sandoval is a 37 y.o. female with medical history significant for going tobacco abuse who presented to the emergency department after she woke up this morning with severe pain and swelling to her right knee.  She noted some erythema, but no drainage from an abrasion from the previous night where she was ripping up carpet's.  She apparently received a cut on her knee from a staple in the carpeting.  The pain is severe enough to where she cannot weight-bear.  She also cannot flex her knee due to the swelling.    Clinical Impression  Patient limited to sitting up at bedside for approximately 2-3 minutes and unable to attempt standing, transfers or gait due to c/o severe pain right knee.  Patient required assistance to move RLE during bed mobility.  Patient will benefit from continued physical therapy in hospital and recommended venue below to increase strength, balance, endurance for safe ADLs and gait.    Follow Up Recommendations Supervision for mobility/OOB;Home health PT    Equipment Recommendations  Rolling walker with 5" wheels    Recommendations for Other Services       Precautions / Restrictions Precautions Precautions: Fall Restrictions Weight Bearing Restrictions: No      Mobility  Bed Mobility Overal bed mobility: Needs Assistance Bed Mobility: Supine to Sit;Sit to Supine     Supine to sit: Mod assist Sit to supine: Min assist   General bed mobility comments: requires assistance to move RLE  Transfers                    Ambulation/Gait                Stairs            Wheelchair Mobility    Modified Rankin (Stroke Patients Only)       Balance Overall balance assessment: Needs assistance Sitting-balance support: Feet supported;No upper extremity supported Sitting balance-Leahy  Scale: Fair Sitting balance - Comments: limited for sitting due to c/o severe RLE pain                                     Pertinent Vitals/Pain Pain Assessment: 0-10 Pain Score: 6  Pain Location: right knee Pain Descriptors / Indicators: Aching;Discomfort;Grimacing;Sharp Pain Intervention(s): Limited activity within patient's tolerance;Monitored during session    Home Living Family/patient expects to be discharged to:: Private residence Living Arrangements: Alone Available Help at Discharge: Family;Friend(s) Type of Home: Mobile home Home Access: Stairs to enter Entrance Stairs-Rails: Right;Left;Can reach both Entrance Stairs-Number of Steps: 4-5   Home Equipment: Cane - single point      Prior Function Level of Independence: Independent         Comments: community ambulator, drives     Higher education careers adviserHand Dominance        Extremity/Trunk Assessment   Upper Extremity Assessment Upper Extremity Assessment: Overall WFL for tasks assessed    Lower Extremity Assessment Lower Extremity Assessment: Generalized weakness;RLE deficits/detail;LLE deficits/detail RLE Deficits / Details: grossly 2+/5 due to pain LLE Deficits / Details: grossly 4+/5       Communication   Communication: No difficulties  Cognition Arousal/Alertness: Awake/alert Behavior During Therapy: WFL for tasks assessed/performed Overall Cognitive Status: Within Functional Limits for tasks assessed  General Comments      Exercises     Assessment/Plan    PT Assessment Patient needs continued PT services  PT Problem List         PT Treatment Interventions Gait training;Functional mobility training;Stair training;Therapeutic activities;Patient/family education;Therapeutic exercise    PT Goals (Current goals can be found in the Care Plan section)  Acute Rehab PT Goals Patient Stated Goal: return home able to walk PT Goal Formulation:  With patient Time For Goal Achievement: 12/19/17 Potential to Achieve Goals: Good    Frequency Min 3X/week   Barriers to discharge        Co-evaluation               AM-PAC PT "6 Clicks" Daily Activity  Outcome Measure Difficulty turning over in bed (including adjusting bedclothes, sheets and blankets)?: A Lot Difficulty moving from lying on back to sitting on the side of the bed? : A Lot Difficulty sitting down on and standing up from a chair with arms (e.g., wheelchair, bedside commode, etc,.)?: Unable Help needed moving to and from a bed to chair (including a wheelchair)?: Total Help needed walking in hospital room?: Total Help needed climbing 3-5 steps with a railing? : Total 6 Click Score: 8    End of Session   Activity Tolerance: Patient limited by pain Patient left: in bed;with call bell/phone within reach Nurse Communication: Mobility status PT Visit Diagnosis: Unsteadiness on feet (R26.81);Other abnormalities of gait and mobility (R26.89);Muscle weakness (generalized) (M62.81)    Time: 5409-8119 PT Time Calculation (min) (ACUTE ONLY): 17 min   Charges:   PT Evaluation $PT Eval Moderate Complexity: 1 Mod PT Treatments $Therapeutic Activity: 8-22 mins   PT G Codes:        11:22 AM, December 29, 2017 Ocie Bob, MPT Physical Therapist with [facility] [phone] office [phone] mobile phone

## 2017-12-06 LAB — CBC WITH DIFFERENTIAL/PLATELET
Basophils Absolute: 0 10*3/uL (ref 0.0–0.1)
Basophils Relative: 0 %
EOS ABS: 0.1 10*3/uL (ref 0.0–0.7)
Eosinophils Relative: 1 %
HCT: 37.4 % (ref 36.0–46.0)
HEMOGLOBIN: 12.2 g/dL (ref 12.0–15.0)
LYMPHS ABS: 2.3 10*3/uL (ref 0.7–4.0)
Lymphocytes Relative: 23 %
MCH: 30.6 pg (ref 26.0–34.0)
MCHC: 32.6 g/dL (ref 30.0–36.0)
MCV: 93.7 fL (ref 78.0–100.0)
MONOS PCT: 6 %
Monocytes Absolute: 0.6 10*3/uL (ref 0.1–1.0)
NEUTROS PCT: 70 %
Neutro Abs: 7.1 10*3/uL (ref 1.7–7.7)
Platelets: 297 10*3/uL (ref 150–400)
RBC: 3.99 MIL/uL (ref 3.87–5.11)
RDW: 13.5 % (ref 11.5–15.5)
WBC: 10.1 10*3/uL (ref 4.0–10.5)

## 2017-12-06 LAB — GLUCOSE, CAPILLARY
GLUCOSE-CAPILLARY: 189 mg/dL — AB (ref 65–99)
Glucose-Capillary: 103 mg/dL — ABNORMAL HIGH (ref 65–99)
Glucose-Capillary: 117 mg/dL — ABNORMAL HIGH (ref 65–99)
Glucose-Capillary: 124 mg/dL — ABNORMAL HIGH (ref 65–99)
Glucose-Capillary: 137 mg/dL — ABNORMAL HIGH (ref 65–99)
Glucose-Capillary: 157 mg/dL — ABNORMAL HIGH (ref 65–99)

## 2017-12-06 LAB — HIV ANTIBODY (ROUTINE TESTING W REFLEX): HIV SCREEN 4TH GENERATION: NONREACTIVE

## 2017-12-06 MED ORDER — OXYCODONE-ACETAMINOPHEN 5-325 MG PO TABS
1.0000 | ORAL_TABLET | ORAL | Status: DC | PRN
  Administered 2017-12-06 – 2017-12-07 (×2): 1 via ORAL
  Filled 2017-12-06 (×2): qty 1

## 2017-12-06 MED ORDER — KETOROLAC TROMETHAMINE 30 MG/ML IJ SOLN
30.0000 mg | Freq: Four times a day (QID) | INTRAMUSCULAR | Status: DC
  Administered 2017-12-06 – 2017-12-07 (×4): 30 mg via INTRAVENOUS
  Filled 2017-12-06 (×4): qty 1

## 2017-12-06 NOTE — Progress Notes (Signed)
Patient ID: Suzanne Sandoval, female   DOB: 03/14/1981, 37 y.o.   MRN: [id number] BP 110/75 (BP Location: Left Arm)   Pulse 84   Temp 98 F (36.7 C) (Oral)   Resp 18   Ht 5\' 5"  (1.651 m)   Wt 120 lb (54.4 kg)   LMP 10/23/2017   SpO2 100%   BMI 19.97 kg/m   CBC Latest Ref Rng & Units 12/06/2017 12/05/2017 12/04/2017  WBC 4.0 - 10.5 K/uL 10.1 12.8(H) 15.7(H)  Hemoglobin 12.0 - 15.0 g/dL 40.912.2 81.112.0 91.413.4  Hematocrit 36.0 - 46.0 % 37.4 37.3 41.7  Platelets 150 - 400 K/uL 297 292 316    The patient's MRI has been completed and I have reviewed it.  I do not see septic joint and there is no abscess.  The patient can be treated with IV antibiotics and once she starts to get better conversion to p.o. antibiotics and follow-up with medical provider.

## 2017-12-06 NOTE — Progress Notes (Signed)
PROGRESS NOTE    Suzanne Sandoval  ZOX:[id number] DOB: [birth year] DOA: 12/04/2017 PCP: Patient, No Pcp Per    Brief Narrative:  37 year old female with a history of tobacco use, presents to the hospital with severe pain and swelling of her right knee.  She was noted to have an abrasion over her right knee with surrounding erythema and drainage.  She is been started on intravenous antibiotics.  Orthopedics does not feel that she needs surgical management.  She is slowly improving with IV antibiotics.   Assessment & Plan:   Principal Problem:   Prepatellar bursitis Active Problems:   Tobacco abuse   Cellulitis of right knee   1. Cellulitis of right knee with prepatellar bursitis.  MRI did not indicate any evidence of septic joint.  Orthopedics following.  No surgical intervention recommended at this time.  Continue intravenous antibiotics.  She is slowly improving.  Leukocytosis has resolved. 2. Hypokalemia.  Replace 3. Tobacco reduction in her drug use.  Counseled.  Nicotine patch is been offered 4. Hyperglycemia.  Likely related to acute illness.  A1c is 5.1.  Continue to monitor.  Blood sugars today have been in reasonable range   DVT prophylaxis: Lovenox Code Status: Full code Family Communication: Discussed with patient Disposition Plan: Possible discharge in next 24 hours if infection continues to improve.   Consultants:   Orthopedics  Procedures:     Antimicrobials:   Vancomycin 3/21 >   Subjective: Feels that her pain is mildly better, although still present.  Has difficulty walking.  Objective: Vitals:   12/05/17 1500 12/05/17 1945 12/05/17 2100 12/06/17 0500  BP: 102/67  105/70 110/75  Pulse: 85  82 84  Resp: 16  17 18   Temp: (!) 97.2 F (36.2 C)  98.5 F (36.9 C) 98 F (36.7 C)  TempSrc: Oral  Oral Oral  SpO2: 100% 95% 100% 100%  Weight:      Height:        Intake/Output Summary (Last 24 hours) at 12/06/2017 1548 Last data filed at 12/06/2017  0953 Gross per 24 hour  Intake 830 ml  Output 305 ml  Net 525 ml   Filed Weights   12/04/17 1532 12/04/17 2100 12/05/17 0500  Weight: 54.4 kg (120 lb) 54.4 kg (120 lb) 54.4 kg (120 lb)    Examination:  General exam: Appears calm and comfortable  Respiratory system: Clear to auscultation. Respiratory effort normal. Cardiovascular system: S1 & S2 heard, RRR. No JVD, murmurs, rubs, gallops or clicks. No pedal edema. Gastrointestinal system: Abdomen is nondistended, soft and nontender. No organomegaly or masses felt. Normal bowel sounds heard. Central nervous system: Alert and oriented. No focal neurological deficits. Extremities: Symmetric 5 x 5 power. Skin: Erythema appears to be regressing from demarcated margins.  She still tender to touch over her shin and her knee.  Purulent drainage is noted from wound over the patella. Psychiatry: Judgement and insight appear normal. Mood & affect appropriate.     Data Reviewed: I have personally reviewed following labs and imaging studies  CBC: Recent Labs  Lab 12/04/17 1547 12/05/17 0606 12/06/17 0631  WBC 15.7* 12.8* 10.1  NEUTROABS 13.1*  --  7.1  HGB 13.4 12.0 12.2  HCT 41.7 37.3 37.4  MCV 93.5 93.0 93.7  PLT 316 292 297   Basic Metabolic Panel: Recent Labs  Lab 12/04/17 1547 12/05/17 0606  NA 135 134*  K 3.4* 3.4*  CL 98* 103  CO2 25 22  GLUCOSE 182* 128*  BUN  11 9  CREATININE 0.72 0.63  CALCIUM 9.3 8.1*   GFR: Estimated Creatinine Clearance: 84.3 mL/min (by C-G formula based on SCr of 0.63 mg/dL). Liver Function Tests: Recent Labs  Lab 12/04/17 1547  AST 21  ALT 14  ALKPHOS 75  BILITOT 0.8  PROT 7.3  ALBUMIN 3.8   No results for input(s): LIPASE, AMYLASE in the last 168 hours. No results for input(s): AMMONIA in the last 168 hours. Coagulation Profile: No results for input(s): INR, PROTIME in the last 168 hours. Cardiac Enzymes: No results for input(s): CKTOTAL, CKMB, CKMBINDEX, TROPONINI in the last  168 hours. BNP (last 3 results) No results for input(s): PROBNP in the last 8760 hours. HbA1C: Recent Labs    12/04/17 1900  HGBA1C 5.1   CBG: Recent Labs  Lab 12/05/17 1641 12/06/17 0012 12/06/17 0357 12/06/17 0613 12/06/17 1158  GLUCAP 113* 157* 117* 103* 124*   Lipid Profile: No results for input(s): CHOL, HDL, LDLCALC, TRIG, CHOLHDL, LDLDIRECT in the last 72 hours. Thyroid Function Tests: No results for input(s): TSH, T4TOTAL, FREET4, T3FREE, THYROIDAB in the last 72 hours. Anemia Panel: No results for input(s): VITAMINB12, FOLATE, FERRITIN, TIBC, IRON, RETICCTPCT in the last 72 hours. Sepsis Labs: No results for input(s): PROCALCITON, LATICACIDVEN in the last 168 hours.  Recent Results (from the past 240 hour(s))  Culture, blood (routine x 2)     Status: None (Preliminary result)   Collection Time: 12/04/17  7:09 PM  Result Value Ref Range Status   Specimen Description LEFT ANTECUBITAL  Final   Special Requests   Final    BOTTLES DRAWN AEROBIC AND ANAEROBIC Blood Culture adequate volume   Culture   Final    NO GROWTH < 24 HOURS Performed at [facility], [address], [place], [place] [address]    Report Status PENDING  Incomplete  Culture, blood (routine x 2)     Status: None (Preliminary result)   Collection Time: 12/04/17  7:09 PM  Result Value Ref Range Status   Specimen Description RIGHT ANTECUBITAL  Final   Special Requests   Final    BOTTLES DRAWN AEROBIC AND ANAEROBIC Blood Culture adequate volume   Culture   Final    NO GROWTH < 24 HOURS Performed at [facility], [address], [place], [place] [address]    Report Status PENDING  Incomplete         Radiology Studies: Mr Knee Right Wo Contrast  Result Date: 12/05/2017 CLINICAL DATA:  Swelling and erythema the right knee after sustaining a cut from carpet staples while removing carpeting. Patient also hit her right knee taking of carpet and cleaning up the house. EXAM: MRI OF THE RIGHT KNEE  WITHOUT CONTRAST TECHNIQUE: Multiplanar, multisequence MR imaging of the knee was performed. No intravenous contrast was administered. COMPARISON:  12/04/2017 knee radiographs FINDINGS: Study was compromised by patient motion. No repeats were tolerated per technologist. MENISCI Medial meniscus:  Intact Lateral meniscus:  Intact LIGAMENTS Cruciates:  Intact ACL and PCL. Collaterals: Medial collateral ligament is intact. Lateral collateral ligament complex is intact. CARTILAGE Patellofemoral:  No chondral defect. Medial:  Intact Lateral:  Intact Joint: Small suprapatellar joint effusion with thin suprapatellar plica. Popliteal Fossa:  No Baker cyst. Intact popliteus tendon. Extensor Mechanism:  Intact quadriceps tendon and patellar tendon. Bones: Bone marrow edema along the lateral aspect of the femoral metaphysis, condyle and tibial plateau consistent with bone contusion. No cortical bone loss to suggest osteomyelitis. No acute fracture. Other: Diffuse periarticular subcutaneous  edema consistent with cellulitis is noted about the knee. IMPRESSION: 1. Diffuse soft tissue edema consistent with cellulitis of the left knee. 2. Bone marrow edema as above stated consistent with bone contusions of the lateral femoral metaphysis, condyle and tibial plateau. 3. Intact cruciate and collateral ligaments.  Intact menisci. Electronically Signed   By: Tollie Ethavid  Kwon M.D.   On: 12/05/2017 20:22   Dg Knee Complete 4 Views Right  Result Date: 12/04/2017 CLINICAL DATA:  Fall with knee pain EXAM: RIGHT KNEE - COMPLETE 4+ VIEW COMPARISON:  None. FINDINGS: No evidence of fracture or dislocation. Small suprapatellar joint effusion. No evidence of arthropathy or other focal bone abnormality. Soft tissues are unremarkable. IMPRESSION: Small suprapatellar effusion.  No fracture or dislocation. Electronically Signed   By: Deatra RobinsonKevin  Herman M.D.   On: 12/04/2017 16:16        Scheduled Meds: . enoxaparin (LOVENOX) injection  40 mg  Subcutaneous Q24H  . insulin aspart  0-9 Units Subcutaneous Q6H  . ketorolac  30 mg Intravenous Q6H  . nicotine  14 mg Transdermal Daily   Continuous Infusions: . vancomycin Stopped (12/06/17 0753)     LOS: 1 day    Time spent: 25mins    Erick BlinksJehanzeb Porche Steinberger, MD [facility] Pager [phone]  If 7PM-7AM, please contact night-coverage [email or web address] Password [email or web address] 12/06/2017, 3:48 PM

## 2017-12-06 NOTE — Progress Notes (Signed)
Physical Therapy Treatment Patient Details Name: Garvin Filaabitha M Ouderkirk MRN: [id number] DOB: 03/15/1981 Today's Date: 12/06/2017    History of Present Illness Garvin Filaabitha M Selover is a 37 y.o. female with medical history significant for going tobacco abuse who presented to the emergency department after she woke up this morning with severe pain and swelling to her right knee.  She noted some erythema, but no drainage from an abrasion from the previous night where she was ripping up carpet's.  She apparently received a cut on her knee from a staple in the carpeting.  The pain is severe enough to where she cannot weight-bear.  She also cannot flex her knee due to the swelling.    PT Comments    Patient demonstrates slightly improved movement for moving RLE during bed mobility and sit to stands, limited to a few steps in room due to c/o severe pain left knee with any weight bearing, but overall demonstrates increased tolerance for out of bed and less redness on RLE.  Patient will benefit from continued physical therapy in hospital and recommended venue below to increase strength, balance, endurance for safe ADLs and gait.    Follow Up Recommendations  Supervision for mobility/OOB;Home health PT     Equipment Recommendations  Rolling walker with 5" wheels    Recommendations for Other Services       Precautions / Restrictions Precautions Precautions: Fall Restrictions Weight Bearing Restrictions: No    Mobility  Bed Mobility Overal bed mobility: Needs Assistance Bed Mobility: Supine to Sit;Sit to Supine     Supine to sit: Min assist Sit to supine: Min assist   General bed mobility comments: requires assistance to move RLE  Transfers Overall transfer level: Needs assistance Equipment used: Rolling walker (2 wheeled) Transfers: Sit to/from Stand Sit to Stand: Min assist            Ambulation/Gait Ambulation/Gait assistance: Min assist Ambulation Distance (Feet): 12 Feet Assistive  device: Rolling walker (2 wheeled) Gait Pattern/deviations: Decreased step length - right;Decreased stance time - right;Decreased stride length   Gait velocity interpretation: Below normal speed for age/gender General Gait Details: demonstrates slow labored movement with poor tolerance for weight bearing on RLE, patient instructed in how to toe touch with fair/good return   Stairs            Wheelchair Mobility    Modified Rankin (Stroke Patients Only)       Balance Overall balance assessment: Needs assistance Sitting-balance support: Feet supported;No upper extremity supported Sitting balance-Leahy Scale: Good     Standing balance support: Bilateral upper extremity supported;During functional activity Standing balance-Leahy Scale: Fair                              Cognition Arousal/Alertness: Awake/alert Behavior During Therapy: WFL for tasks assessed/performed Overall Cognitive Status: Within Functional Limits for tasks assessed                                        Exercises General Exercises - Lower Extremity Heel Slides: Supine;AAROM;Strengthening;Right;10 reps    General Comments        Pertinent Vitals/Pain Pain Score: 5  Pain Location: right knee that increases with movement Pain Descriptors / Indicators: Aching;Discomfort;Grimacing;Sharp Pain Intervention(s): Limited activity within patient's tolerance;Monitored during session;Premedicated before session    Home Living  Prior Function            PT Goals (current goals can now be found in the care plan section) Acute Rehab PT Goals Patient Stated Goal: return home able to walk PT Goal Formulation: With patient/family Time For Goal Achievement: 12/19/17 Potential to Achieve Goals: Good Progress towards PT goals: Progressing toward goals    Frequency    Min 3X/week      PT Plan Current plan remains appropriate     Co-evaluation              AM-PAC PT "6 Clicks" Daily Activity  Outcome Measure  Difficulty turning over in bed (including adjusting bedclothes, sheets and blankets)?: A Little Difficulty moving from lying on back to sitting on the side of the bed? : A Little Difficulty sitting down on and standing up from a chair with arms (e.g., wheelchair, bedside commode, etc,.)?: A Little Help needed moving to and from a bed to chair (including a wheelchair)?: A Little Help needed walking in hospital room?: A Lot Help needed climbing 3-5 steps with a railing? : A Lot 6 Click Score: 16    End of Session   Activity Tolerance: Patient limited by pain Patient left: in bed;with call bell/phone within reach;with family/visitor present Nurse Communication: Mobility status PT Visit Diagnosis: Unsteadiness on feet (R26.81);Other abnormalities of gait and mobility (R26.89);Muscle weakness (generalized) (M62.81)     Time: 5409-8119 PT Time Calculation (min) (ACUTE ONLY): 26 min  Charges:  $Therapeutic Activity: 23-37 mins                    G Codes:       2:15 PM, 2018/01/04 Ocie Bob, MPT Physical Therapist with [facility] [phone] office [phone] mobile phone

## 2017-12-06 NOTE — Care Management (Addendum)
Patient does not have PCP or insurance. Appoint[facility]. Added to AVS. Patient not eligible for Home health as she does not have a PCP, [facility].  [facility] voucher given and explained for medication assistance.  RW ordered. Olegario MessierKathy of [facility] notified and will obtain orders and notify patient if she quaifies for charity.

## 2017-12-07 DIAGNOSIS — L03115 Cellulitis of right lower limb: Secondary | ICD-10-CM

## 2017-12-07 DIAGNOSIS — M7041 Prepatellar bursitis, right knee: Principal | ICD-10-CM

## 2017-12-07 DIAGNOSIS — Z720 Tobacco use: Secondary | ICD-10-CM

## 2017-12-07 LAB — GLUCOSE, CAPILLARY
GLUCOSE-CAPILLARY: 101 mg/dL — AB (ref 65–99)
GLUCOSE-CAPILLARY: 255 mg/dL — AB (ref 65–99)

## 2017-12-07 MED ORDER — DOXYCYCLINE HYCLATE 100 MG PO CAPS: 100.0000 mg | ORAL_CAPSULE | Freq: Two times a day (BID) | ORAL | 0 refills | Status: DC

## 2017-12-07 MED ORDER — IBUPROFEN 800 MG PO TABS: 800.0000 mg | ORAL_TABLET | Freq: Three times a day (TID) | ORAL | 0 refills | Status: DC | PRN

## 2017-12-07 MED ORDER — IBUPROFEN 800 MG PO TABS: 800.0000 mg | ORAL_TABLET | Freq: Three times a day (TID) | ORAL | 0 refills | Status: AC | PRN

## 2017-12-07 NOTE — Discharge Summary (Signed)
Physician Discharge Summary  Suzanne Sandoval ZOX:[id number] DOB: [birth year] DOA: 12/04/2017  PCP: Patient, No Pcp Per  Admit date: 12/04/2017 Discharge date: 12/07/2017  Admitted From: Home Disposition: Home  Recommendations for Outpatient Follow-up:  1. Follow up with PCP in 1-2 weeks 2. Please obtain BMP/CBC in one week 3. Follow-up with orthopedics in the next 1-2 weeks  Home Health: Equipment/Devices:   Discharge Condition: Stable CODE STATUS: Full code Diet recommendation: Regular diet  Brief/Interim Summary: 37 year old female with a history of tobacco use, admitted to the hospital with severe pain and swelling of her right knee.  She was noted to have an abrasion over her right knee with surrounding erythema and drainage.  The patient was started on intravenous antibiotics for right knee cellulitis as well as prepatellar bursitis.  She underwent MRI of her knee that did not have any evidence of septic joint.  She was seen by orthopedics who did not feel that surgical management was needed at this time and recommendations are for continued IV antibiotic therapy.  Patient was treated with intravenous vancomycin and has improved.  WBC count is currently in normal range.  She is not having any fevers.  Overall swelling of her knee has significantly improved.  Erythema is also nearly resolved.  She will be transitioned to a course of doxycycline.  She will follow-up with orthopedics in the next 1-2 weeks.  She is otherwise stable for discharge home.  Discharge Diagnoses:  Principal Problem:   Prepatellar bursitis Active Problems:   Tobacco abuse   Cellulitis of right knee    Discharge Instructions  Discharge Instructions    Diet - low sodium heart healthy   Complete by:  As directed    Increase activity slowly   Complete by:  As directed      Allergies as of 12/07/2017   No Known Allergies     Medication List    TAKE these medications   acetaminophen 325 MG  tablet Commonly known as:  TYLENOL Take 1,950 mg by mouth every 6 (six) hours as needed for moderate pain.   doxycycline 100 MG capsule Commonly known as:  VIBRAMYCIN Take 1 capsule (100 mg total) by mouth 2 (two) times daily.   ibuprofen 800 MG tablet Commonly known as:  ADVIL,MOTRIN Take 1 tablet (800 mg total) by mouth every 8 (eight) hours as needed.      Follow-up Information    [facility], [facility]. Go to.   Why:  Thursday March 28th at 8:00, arrive at 7:45 to fill out paperwork Bring ID, proof of income for everyone in household, medication and discharge papers from hospital, if you can't make appt. please call and cancel/reschedule Contact information: [address] [place] [address] [phone]        Vickki Hearing, MD. Schedule an appointment as soon as possible for a visit in 2 week(s).   Specialties:  Orthopedic Surgery, Radiology Contact information: [address] [place] [address] [phone]          No Known Allergies  Consultations:  Orthopedics   Procedures/Studies: Mr Knee Right Wo Contrast  Result Date: 12/05/2017 CLINICAL DATA:  Swelling and erythema the right knee after sustaining a cut from carpet staples while removing carpeting. Patient also hit her right knee taking of carpet and cleaning up the house. EXAM: MRI OF THE RIGHT KNEE WITHOUT CONTRAST TECHNIQUE: Multiplanar, multisequence MR imaging of the knee was performed. No intravenous contrast was administered. COMPARISON:  12/04/2017  knee radiographs FINDINGS: Study was compromised by patient motion. No repeats were tolerated per technologist. MENISCI Medial meniscus:  Intact Lateral meniscus:  Intact LIGAMENTS Cruciates:  Intact ACL and PCL. Collaterals: Medial collateral ligament is intact. Lateral collateral ligament complex is intact. CARTILAGE Patellofemoral:  No chondral defect. Medial:  Intact Lateral:  Intact Joint: Small suprapatellar joint effusion  with thin suprapatellar plica. Popliteal Fossa:  No Baker cyst. Intact popliteus tendon. Extensor Mechanism:  Intact quadriceps tendon and patellar tendon. Bones: Bone marrow edema along the lateral aspect of the femoral metaphysis, condyle and tibial plateau consistent with bone contusion. No cortical bone loss to suggest osteomyelitis. No acute fracture. Other: Diffuse periarticular subcutaneous edema consistent with cellulitis is noted about the knee. IMPRESSION: 1. Diffuse soft tissue edema consistent with cellulitis of the left knee. 2. Bone marrow edema as above stated consistent with bone contusions of the lateral femoral metaphysis, condyle and tibial plateau. 3. Intact cruciate and collateral ligaments.  Intact menisci. Electronically Signed   By: Tollie Eth M.D.   On: 12/05/2017 20:22   Dg Knee Complete 4 Views Right  Result Date: 12/04/2017 CLINICAL DATA:  Fall with knee pain EXAM: RIGHT KNEE - COMPLETE 4+ VIEW COMPARISON:  None. FINDINGS: No evidence of fracture or dislocation. Small suprapatellar joint effusion. No evidence of arthropathy or other focal bone abnormality. Soft tissues are unremarkable. IMPRESSION: Small suprapatellar effusion.  No fracture or dislocation. Electronically Signed   By: Deatra Robinson M.D.   On: 12/04/2017 16:16        Subjective: She reports that right knee swelling is better.  It is still painful, but she feels that it is improving.  Discharge Exam: Vitals:   12/07/17 0500 12/07/17 1300  BP: 110/63 109/61  Pulse: 73 78  Resp: 18 19  Temp: 98.1 F (36.7 C) 98.3 F (36.8 C)  SpO2: 99% 99%   Vitals:   12/06/17 2004 12/06/17 2100 12/07/17 0500 12/07/17 1300  BP:  117/72 110/63 109/61  Pulse:  80 73 78  Resp:  18 18 19   Temp:  (!) 97.4 F (36.3 C) 98.1 F (36.7 C) 98.3 F (36.8 C)  TempSrc:  Oral Oral Oral  SpO2: 98% 100% 99% 99%  Weight:      Height:        General: Pt is alert, awake, not in acute distress Cardiovascular: RRR, S1/S2 +,  no rubs, no gallops Respiratory: CTA bilaterally, no wheezing, no rhonchi Abdominal: Soft, NT, ND, bowel sounds + Extremities: no edema, no cyanosis.  Right knee erythema has significantly improved.  Wound on right patella has some drainage, but no purulence appreciated.  Overall swelling has significantly improved    The results of significant diagnostics from this hospitalization (including imaging, microbiology, ancillary and laboratory) are listed below for reference.     Microbiology: Recent Results (from the past 240 hour(s))  Culture, blood (routine x 2)     Status: None (Preliminary result)   Collection Time: 12/04/17  7:09 PM  Result Value Ref Range Status   Specimen Description LEFT ANTECUBITAL  Final   Special Requests   Final    BOTTLES DRAWN AEROBIC AND ANAEROBIC Blood Culture adequate volume   Culture   Final    NO GROWTH 3 DAYS Performed at [facility], [address], [place], [place] [address]    Report Status PENDING  Incomplete  Culture, blood (routine x 2)     Status: None (Preliminary result)   Collection Time: 12/04/17  7:09  PM  Result Value Ref Range Status   Specimen Description RIGHT ANTECUBITAL  Final   Special Requests   Final    BOTTLES DRAWN AEROBIC AND ANAEROBIC Blood Culture adequate volume   Culture   Final    NO GROWTH 3 DAYS Performed at [facility], [address], [place], [place] [address]    Report Status PENDING  Incomplete  Aerobic Culture (superficial specimen)     Status: None (Preliminary result)   Collection Time: 12/05/17  8:15 PM  Result Value Ref Range Status   Specimen Description   Final    KNEE Performed at [facility], [address] [place], [place] [address]    Special Requests   Final    Normal Performed at [facility], [address] [place], [place] [address]    Gram Stain   Final    FEW WBC PRESENT,BOTH PMN AND MONONUCLEAR FEW GRAM POSITIVE COCCI Performed at [facility], [address]  [place], [place] [address]    Culture MODERATE GROUP A STREP (S.PYOGENES) ISOLATED  Final   Report Status PENDING  Incomplete     Labs: BNP (last 3 results) No results for input(s): BNP in the last 8760 hours. Basic Metabolic Panel: Recent Labs  Lab 12/04/17 1547 12/05/17 0606  NA 135 134*  K 3.4* 3.4*  CL 98* 103  CO2 25 22  GLUCOSE 182* 128*  BUN 11 9  CREATININE 0.72 0.63  CALCIUM 9.3 8.1*   Liver Function Tests: Recent Labs  Lab 12/04/17 1547  AST 21  ALT 14  ALKPHOS 75  BILITOT 0.8  PROT 7.3  ALBUMIN 3.8   No results for input(s): LIPASE, AMYLASE in the last 168 hours. No results for input(s): AMMONIA in the last 168 hours. CBC: Recent Labs  Lab 12/04/17 1547 12/05/17 0606 12/06/17 0631  WBC 15.7* 12.8* 10.1  NEUTROABS 13.1*  --  7.1  HGB 13.4 12.0 12.2  HCT 41.7 37.3 37.4  MCV 93.5 93.0 93.7  PLT 316 292 297   Cardiac Enzymes: No results for input(s): CKTOTAL, CKMB, CKMBINDEX, TROPONINI in the last 168 hours. BNP: Invalid input(s): POCBNP CBG: Recent Labs  Lab 12/06/17 1158 12/06/17 1810 12/06/17 2353 12/07/17 0537 12/07/17 1158  GLUCAP 124* 189* 137* 101* 255*   D-Dimer No results for input(s): DDIMER in the last 72 hours. Hgb A1c No results for input(s): HGBA1C in the last 72 hours. Lipid Profile No results for input(s): CHOL, HDL, LDLCALC, TRIG, CHOLHDL, LDLDIRECT in the last 72 hours. Thyroid function studies No results for input(s): TSH, T4TOTAL, T3FREE, THYROIDAB in the last 72 hours.  Invalid input(s): FREET3 Anemia work up No results for input(s): VITAMINB12, FOLATE, FERRITIN, TIBC, IRON, RETICCTPCT in the last 72 hours. Urinalysis No results found for: COLORURINE, APPEARANCEUR, LABSPEC, PHURINE, GLUCOSEU, HGBUR, BILIRUBINUR, KETONESUR, PROTEINUR, UROBILINOGEN, NITRITE, LEUKOCYTESUR Sepsis Labs Invalid input(s): PROCALCITONIN,  WBC,  LACTICIDVEN Microbiology Recent Results (from the past 240 hour(s))  Culture, blood (routine x  2)     Status: None (Preliminary result)   Collection Time: 12/04/17  7:09 PM  Result Value Ref Range Status   Specimen Description LEFT ANTECUBITAL  Final   Special Requests   Final    BOTTLES DRAWN AEROBIC AND ANAEROBIC Blood Culture adequate volume   Culture   Final    NO GROWTH 3 DAYS Performed at [facility], [address], [place], [place] [address]    Report Status PENDING  Incomplete  Culture, blood (routine x 2)  Status: None (Preliminary result)   Collection Time: 12/04/17  7:09 PM  Result Value Ref Range Status   Specimen Description RIGHT ANTECUBITAL  Final   Special Requests   Final    BOTTLES DRAWN AEROBIC AND ANAEROBIC Blood Culture adequate volume   Culture   Final    NO GROWTH 3 DAYS Performed at [facility], [address] [place], [place] [address]    Report Status PENDING  Incomplete  Aerobic Culture (superficial specimen)     Status: None (Preliminary result)   Collection Time: 12/05/17  8:15 PM  Result Value Ref Range Status   Specimen Description   Final    KNEE Performed at [facility], [address] [place], [place] [address]    Special Requests   Final    Normal Performed at [facility], [address] [place], [place] [address]    Gram Stain   Final    FEW WBC PRESENT,BOTH PMN AND MONONUCLEAR FEW GRAM POSITIVE COCCI Performed at [facility], [address] [place], [place] [address]    Culture MODERATE GROUP A STREP (S.PYOGENES) ISOLATED  Final   Report Status PENDING  Incomplete     Time coordinating discharge: Over 30 minutes  SIGNED:   Erick BlinksJehanzeb Memon, MD  [facility] 12/07/2017, 7:42 PM Pager   If 7PM-7AM, please contact night-coverage [email or web address] Password [email or web address]

## 2017-12-09 LAB — CULTURE, BLOOD (ROUTINE X 2)
CULTURE: NO GROWTH
Culture: NO GROWTH
SPECIAL REQUESTS: ADEQUATE
Special Requests: ADEQUATE

## 2017-12-10 LAB — AEROBIC CULTURE W GRAM STAIN (SUPERFICIAL SPECIMEN)

## 2017-12-10 LAB — AEROBIC CULTURE  (SUPERFICIAL SPECIMEN): SPECIAL REQUESTS: NORMAL

## 2017-12-20 ENCOUNTER — Ambulatory Visit: Payer: Self-pay | Admitting: Orthopedic Surgery

## 2017-12-20 ENCOUNTER — Encounter: Payer: Self-pay | Admitting: Orthopedic Surgery

## 2017-12-20 NOTE — Progress Notes (Deleted)
Progress Note   Patient ID: Suzanne Sandoval, female   DOB: 08/03/1981, 37 y.o.   MRN: [id number]  No chief complaint on file.   HPI  Brief/Interim Summary: 37 year old female with a history of tobacco use, admitted to the hospital with severe pain and swelling of her right knee.  She was noted to have an abrasion over her right knee with surrounding erythema and drainage.  The patient was started on intravenous antibiotics for right knee cellulitis as well as prepatellar bursitis.  She underwent MRI of her knee that did not have any evidence of septic joint.  She was seen by orthopedics who did not feel that surgical management was needed at this time and recommendations are for continued IV antibiotic therapy.  Patient was treated with intravenous vancomycin and has improved.  WBC count is currently in normal range.  She is not having any fevers.  Overall swelling of her knee has significantly improved.  Erythema is also nearly resolved.  She will be transitioned to a course of doxycycline.  She will follow-up with orthopedics in the next 1-2 weeks.  She is otherwise stable for discharge home.   Discharge Diagnoses:  Principal Problem:   Prepatellar bursitis Active Problems:   Tobacco abuse   Cellulitis of right knee  ROS No outpatient medications have been marked as taking for the 12/20/17 encounter (Appointment) with Vickki HearingHarrison, Birttany Dechellis E, MD.    No Known Allergies   There were no vitals taken for this visit.  Physical Exam   Medical decision-making No diagnosis found.    No orders of the defined types were placed in this encounter.    Fuller CanadaStanley Jeramia Saleeby, MD 12/20/2017 7:58 AM

## 2017-12-31 ENCOUNTER — Encounter (HOSPITAL_COMMUNITY): Payer: Self-pay | Admitting: Emergency Medicine

## 2017-12-31 ENCOUNTER — Other Ambulatory Visit: Payer: Self-pay

## 2017-12-31 ENCOUNTER — Emergency Department (HOSPITAL_COMMUNITY): Payer: Self-pay

## 2017-12-31 ENCOUNTER — Emergency Department (HOSPITAL_COMMUNITY)
Admission: EM | Admit: 2017-12-31 | Discharge: 2017-12-31 | Discharge status: Against Medical Advice | Payer: Self-pay | Attending: Emergency Medicine | Admitting: Emergency Medicine

## 2017-12-31 DIAGNOSIS — T07XXXA Unspecified multiple injuries, initial encounter: Secondary | ICD-10-CM | POA: Insufficient documentation

## 2017-12-31 DIAGNOSIS — Y9389 Activity, other specified: Secondary | ICD-10-CM | POA: Insufficient documentation

## 2017-12-31 DIAGNOSIS — Z5329 Procedure and treatment not carried out because of patient's decision for other reasons: Secondary | ICD-10-CM | POA: Insufficient documentation

## 2017-12-31 DIAGNOSIS — F17210 Nicotine dependence, cigarettes, uncomplicated: Secondary | ICD-10-CM | POA: Insufficient documentation

## 2017-12-31 DIAGNOSIS — Z79899 Other long term (current) drug therapy: Secondary | ICD-10-CM | POA: Insufficient documentation

## 2017-12-31 DIAGNOSIS — Y999 Unspecified external cause status: Secondary | ICD-10-CM | POA: Insufficient documentation

## 2017-12-31 DIAGNOSIS — Y929 Unspecified place or not applicable: Secondary | ICD-10-CM | POA: Insufficient documentation

## 2017-12-31 DIAGNOSIS — S0181XA Laceration without foreign body of other part of head, initial encounter: Secondary | ICD-10-CM | POA: Insufficient documentation

## 2017-12-31 DIAGNOSIS — S0990XA Unspecified injury of head, initial encounter: Secondary | ICD-10-CM

## 2017-12-31 LAB — I-STAT BETA HCG BLOOD, ED (MC, WL, AP ONLY): I-stat hCG, quantitative: <5 m[IU]/mL (ref <5)

## 2017-12-31 LAB — CBC WITH DIFFERENTIAL/PLATELET
BASOS ABS: 0 10*3/uL (ref 0.0–0.1)
Basophils Relative: 0 %
Eosinophils Absolute: 0.1 10*3/uL (ref 0.0–0.7)
Eosinophils Relative: 0 %
HEMATOCRIT: 39.2 % (ref 36.0–46.0)
HEMOGLOBIN: 13.2 g/dL (ref 12.0–15.0)
LYMPHS ABS: 2.2 10*3/uL (ref 0.7–4.0)
Lymphocytes Relative: 12 %
MCH: 30.3 pg (ref 26.0–34.0)
MCHC: 33.7 g/dL (ref 30.0–36.0)
MCV: 90.1 fL (ref 78.0–100.0)
Monocytes Absolute: 1.2 10*3/uL — ABNORMAL HIGH (ref 0.1–1.0)
Monocytes Relative: 6 %
NEUTROS ABS: 15.1 10*3/uL — AB (ref 1.7–7.7)
NEUTROS PCT: 82 %
Platelets: 337 10*3/uL (ref 150–400)
RBC: 4.35 MIL/uL (ref 3.87–5.11)
RDW: 13.7 % (ref 11.5–15.5)
WBC: 18.5 10*3/uL — AB (ref 4.0–10.5)

## 2017-12-31 LAB — COMPREHENSIVE METABOLIC PANEL
ALBUMIN: 4.1 g/dL (ref 3.5–5.0)
ALK PHOS: 80 U/L (ref 38–126)
ALT: 10 U/L — ABNORMAL LOW (ref 14–54)
AST: 16 U/L (ref 15–41)
Anion gap: 10 (ref 5–15)
BILIRUBIN TOTAL: 0.8 mg/dL (ref 0.3–1.2)
BUN: 12 mg/dL (ref 6–20)
CALCIUM: 9.3 mg/dL (ref 8.9–10.3)
CO2: 23 mmol/L (ref 22–32)
Chloride: 100 mmol/L — ABNORMAL LOW (ref 101–111)
Creatinine, Ser: 0.82 mg/dL (ref 0.44–1.00)
GFR calc Af Amer: >60 mL/min (ref >60)
GFR calc non Af Amer: >60 mL/min (ref >60)
GLUCOSE: 87 mg/dL (ref 65–99)
Potassium: 3.4 mmol/L — ABNORMAL LOW (ref 3.5–5.1)
SODIUM: 133 mmol/L — AB (ref 135–145)
TOTAL PROTEIN: 7.3 g/dL (ref 6.5–8.1)

## 2017-12-31 MED ORDER — BACITRACIN ZINC 500 UNIT/GM EX OINT
1.0000 "application " | TOPICAL_OINTMENT | Freq: Two times a day (BID) | CUTANEOUS | Status: DC
  Filled 2017-12-31: qty 0.9

## 2017-12-31 MED ORDER — LIDOCAINE-EPINEPHRINE (PF) 1 %-1:200000 IJ SOLN
10.0000 mL | Freq: Once | INTRAMUSCULAR | Status: DC
  Filled 2017-12-31: qty 30

## 2017-12-31 NOTE — ED Notes (Signed)
Suture cart to bedside. 
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
## 2017-12-31 NOTE — ED Notes (Signed)
Pt states that she has to leave and she will return to the er once she takes care of what she has to with her kids the patient verbalized understanding of the risk of her leaving these were explained to her by Dr Hyacinth MeekerMiller and myself. She agreeed to wear the c-collar dressing applied to forehead before the patient left.

## 2018-04-09 ENCOUNTER — Emergency Department (HOSPITAL_COMMUNITY)
Admission: EM | Admit: 2018-04-09 | Discharge: 2018-04-09 | Disposition: A | Discharge status: Home / Self Care | Payer: Self-pay | Attending: Emergency Medicine | Admitting: Emergency Medicine

## 2018-04-09 ENCOUNTER — Encounter (HOSPITAL_COMMUNITY): Payer: Self-pay | Admitting: Emergency Medicine

## 2018-04-09 DIAGNOSIS — Y929 Unspecified place or not applicable: Secondary | ICD-10-CM | POA: Insufficient documentation

## 2018-04-09 DIAGNOSIS — Y939 Activity, unspecified: Secondary | ICD-10-CM | POA: Insufficient documentation

## 2018-04-09 DIAGNOSIS — Z79899 Other long term (current) drug therapy: Secondary | ICD-10-CM | POA: Insufficient documentation

## 2018-04-09 DIAGNOSIS — S0502XA Injury of conjunctiva and corneal abrasion without foreign body, left eye, initial encounter: Secondary | ICD-10-CM | POA: Insufficient documentation

## 2018-04-09 DIAGNOSIS — Y999 Unspecified external cause status: Secondary | ICD-10-CM | POA: Insufficient documentation

## 2018-04-09 DIAGNOSIS — X58XXXA Exposure to other specified factors, initial encounter: Secondary | ICD-10-CM | POA: Insufficient documentation

## 2018-04-09 DIAGNOSIS — F17210 Nicotine dependence, cigarettes, uncomplicated: Secondary | ICD-10-CM | POA: Insufficient documentation

## 2018-04-09 MED ORDER — CYCLOPENTOLATE HCL 1 % OP SOLN
1.0000 [drp] | Freq: Once | OPHTHALMIC | Status: DC
  Filled 2018-04-09: qty 2

## 2018-04-09 MED ORDER — TETRACAINE HCL 0.5 % OP SOLN
2.0000 [drp] | Freq: Once | OPHTHALMIC | Status: DC
  Filled 2018-04-09: qty 4

## 2018-04-09 MED ORDER — CYCLOPENTOLATE-PHENYLEPHRINE 0.2-1 % OP SOLN
1.0000 [drp] | Freq: Once | OPHTHALMIC | Status: AC
  Administered 2018-04-09: 1 [drp] via OPHTHALMIC

## 2018-04-09 MED ORDER — ERYTHROMYCIN 5 MG/GM OP OINT: TOPICAL_OINTMENT | OPHTHALMIC | 0 refills | Status: DC

## 2018-04-09 MED ORDER — FLUORESCEIN SODIUM 1 MG OP STRP
1.0000 | ORAL_STRIP | Freq: Once | OPHTHALMIC | Status: AC
  Administered 2018-04-09: 1 via OPHTHALMIC
  Filled 2018-04-09: qty 1

## 2018-04-09 MED ORDER — OXYCODONE-ACETAMINOPHEN 5-325 MG PO TABS
1.0000 | ORAL_TABLET | Freq: Once | ORAL | Status: AC
  Administered 2018-04-09: 1 via ORAL
  Filled 2018-04-09: qty 1

## 2018-04-09 MED ORDER — ERYTHROMYCIN 5 MG/GM OP OINT: TOPICAL_OINTMENT | OPHTHALMIC | 0 refills | Status: AC

## 2018-04-09 MED ORDER — OXYCODONE-ACETAMINOPHEN 5-325 MG PO TABS: 1.0000 | ORAL_TABLET | ORAL | 0 refills | Status: AC | PRN

## 2018-04-09 MED ORDER — ERYTHROMYCIN 5 MG/GM OP OINT
TOPICAL_OINTMENT | Freq: Once | OPHTHALMIC | Status: AC
  Administered 2018-04-09: 1 via OPHTHALMIC
  Filled 2018-04-09: qty 3.5

## 2018-04-09 MED ORDER — CYCLOPENTOLATE-PHENYLEPHRINE 0.2-1 % OP SOLN
OPHTHALMIC | Status: AC
  Administered 2018-04-09: 1 [drp] via OPHTHALMIC
  Filled 2018-04-09: qty 2
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
## 2018-04-09 NOTE — ED Provider Notes (Signed)
[facility] EMERGENCY DEPARTMENT Provider Note   CSN: [id number] Arrival date & time: 04/09/18  0202     History   Chief Complaint Chief Complaint  Patient presents with  . Foreign Body in Eye    HPI Suzanne Sandoval is a 37 y.o. female.  The history is provided by the patient.  She states that she was working on her truck when the corner of a piece of carpet struck her in her left eye.  She feels like there is something in her eye.  She denies other injury.  She is unable to open her eye to see if vision is affected.  History reviewed. No pertinent past medical history.  Patient Active Problem List   Diagnosis Date Noted  . Cellulitis of right knee 12/05/2017  . Prepatellar bursitis 12/04/2017  . Tobacco abuse 12/04/2017    Past Surgical History:  Procedure Laterality Date  . APPENDECTOMY    . TUBAL LIGATION       OB History   None      Home Medications    Prior to Admission medications   Medication Sig Start Date End Date Taking? Authorizing Provider  acetaminophen (TYLENOL) 325 MG tablet Take 1,950 mg by mouth every 6 (six) hours as needed for moderate pain.    [provider]  Aspirin-Acetaminophen-Caffeine (EXCEDRIN PO) Take 2 tablets by mouth daily as needed.    [provider]  doxycycline (VIBRAMYCIN) 100 MG capsule Take 1 capsule (100 mg total) by mouth 2 (two) times daily. 12/07/17   Erick BlinksMemon, Jehanzeb, MD  ibuprofen (ADVIL,MOTRIN) 800 MG tablet Take 1 tablet (800 mg total) by mouth every 8 (eight) hours as needed. 12/07/17   Erick BlinksMemon, Jehanzeb, MD    Family History History reviewed. No pertinent family history.  Social History Social History   Tobacco Use  . Smoking status: Current Every Day Smoker    Packs/day: 2.00    Types: Cigarettes  . Smokeless tobacco: Never Used  Substance Use Topics  . Alcohol use: Yes    Comment: 12 pack/week  . Drug use: Yes    Types: Marijuana, Cocaine    Comment:  cocaine 04/05/18 last used      Allergies   Patient has no known allergies.   Review of Systems Review of Systems  All other systems reviewed and are negative.    Physical Exam Updated Vital Signs BP 135/71 (BP Location: Right Arm)   Pulse 75   Temp 97.8 F (36.6 C) (Oral)   Resp 20   Ht 5\' 4"  (1.626 m)   Wt 56.7 kg (125 lb)   SpO2 98%   BMI 21.46 kg/m   Physical Exam  Nursing note and vitals reviewed.  37 year old female, obviously in pain, but in no acute distress. Vital signs are normal. Oxygen saturation is 98%, which is normal. Head is normocephalic and atraumatic.  Right pupil is 4 mm and reactive with full extraocular movements.  Unable to open her left eye for evaluation. Oropharynx is clear. Neck is nontender and supple without adenopathy or JVD. Back is nontender and there is no CVA tenderness. Lungs are clear without rales, wheezes, or rhonchi. Chest is nontender. Heart has regular rate and rhythm without murmur. Abdomen is soft, flat, nontender without masses or hepatosplenomegaly and peristalsis is normoactive. Extremities have no cyanosis or edema, full range of motion is present. Skin is warm and dry without rash. Neurologic: Mental status is normal, cranial nerves are intact, there are no  motor or sensory deficits.  ED Treatments / Results   Procedures Procedures   Medications Ordered in ED Medications  fluorescein ophthalmic strip 1 strip (has no administration in time range)  tetracaine (PONTOCAINE) 0.5 % ophthalmic solution 2 drop (has no administration in time range)  oxyCODONE-acetaminophen (PERCOCET/ROXICET) 5-325 MG per tablet 1 tablet (has no administration in time range)  erythromycin ophthalmic ointment (has no administration in time range)  cyclopentolate-phenylephrine (CYCLOMYDRYL) 0.2-1 % ophthalmic solution 1 drop (has no administration in time range)     Initial Impression / Assessment and Plan / ED Course  I have reviewed the triage vital signs and the  nursing notes.  Left eye injury.  Will get topical anesthesia with tetracaine in order to get adequate evaluation of the eye.  Old records are reviewed, and she has no relevant past visits.  After topical tetracaine was applied, left pupil was noted to be 4 mm and reactive.  No foreign bodies seen.  Anterior chamber was clear.  Eye was stained with floor seen and examined with Woods lamp and large central corneal abrasion was noted.  She was given topical cyclopentolate-phenylephrine and topical erythromycin ointment.  She is discharged with prescription for erythromycin ointment and oxycodone-acetaminophen.  Referred to ophthalmology for recheck in 2 days.  Final Clinical Impressions(s) / ED Diagnoses   Final diagnoses:  Cornea abrasion, left, initial encounter    ED Discharge Orders        Ordered    oxyCODONE-acetaminophen (PERCOCET) 5-325 MG tablet  Every 4 hours PRN     04/09/18 0503    erythromycin ophthalmic ointment     04/09/18 0503       Dione Booze, MD 04/09/18 [phone]

## 2018-05-07 ENCOUNTER — Other Ambulatory Visit: Payer: Self-pay

## 2018-05-07 ENCOUNTER — Emergency Department (HOSPITAL_COMMUNITY): Payer: Self-pay

## 2018-05-07 ENCOUNTER — Encounter (HOSPITAL_COMMUNITY): Payer: Self-pay | Admitting: Emergency Medicine

## 2018-05-07 ENCOUNTER — Emergency Department (HOSPITAL_COMMUNITY)
Admission: EM | Admit: 2018-05-07 | Discharge: 2018-05-07 | Disposition: A | Discharge status: Home / Self Care | Payer: Self-pay | Attending: Emergency Medicine | Admitting: Emergency Medicine

## 2018-05-07 DIAGNOSIS — S1093XA Contusion of unspecified part of neck, initial encounter: Secondary | ICD-10-CM | POA: Insufficient documentation

## 2018-05-07 DIAGNOSIS — S0001XA Abrasion of scalp, initial encounter: Secondary | ICD-10-CM

## 2018-05-07 DIAGNOSIS — Y999 Unspecified external cause status: Secondary | ICD-10-CM | POA: Insufficient documentation

## 2018-05-07 DIAGNOSIS — S0003XA Contusion of scalp, initial encounter: Secondary | ICD-10-CM | POA: Insufficient documentation

## 2018-05-07 DIAGNOSIS — Y929 Unspecified place or not applicable: Secondary | ICD-10-CM | POA: Insufficient documentation

## 2018-05-07 DIAGNOSIS — Z79899 Other long term (current) drug therapy: Secondary | ICD-10-CM | POA: Insufficient documentation

## 2018-05-07 DIAGNOSIS — F17210 Nicotine dependence, cigarettes, uncomplicated: Secondary | ICD-10-CM | POA: Insufficient documentation

## 2018-05-07 DIAGNOSIS — S0083XA Contusion of other part of head, initial encounter: Secondary | ICD-10-CM

## 2018-05-07 DIAGNOSIS — Y939 Activity, unspecified: Secondary | ICD-10-CM | POA: Insufficient documentation

## 2018-05-07 MED ORDER — CYCLOBENZAPRINE HCL 5 MG PO TABS: 5.0000 mg | ORAL_TABLET | Freq: Two times a day (BID) | ORAL | 0 refills | Status: AC | PRN

## 2018-05-07 MED ORDER — OXYCODONE-ACETAMINOPHEN 5-325 MG PO TABS
1.0000 | ORAL_TABLET | Freq: Once | ORAL | Status: AC
  Administered 2018-05-07: 1 via ORAL

## 2018-05-07 MED ORDER — NAPROXEN 500 MG PO TABS: 500.0000 mg | ORAL_TABLET | Freq: Two times a day (BID) | ORAL | 0 refills | Status: AC

## 2018-05-07 MED ORDER — OXYCODONE-ACETAMINOPHEN 5-325 MG PO TABS
ORAL_TABLET | ORAL | Status: AC
  Filled 2018-05-07: qty 1
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
## 2019-04-25 IMAGING — DX DG KNEE COMPLETE 4+V*R*
4 series · 4 of 4 positions shown · non-contrast
Comparison: None.

CLINICAL DATA: Fall with knee pain

EXAM:
RIGHT KNEE - COMPLETE 4+ VIEW

[knee ap]
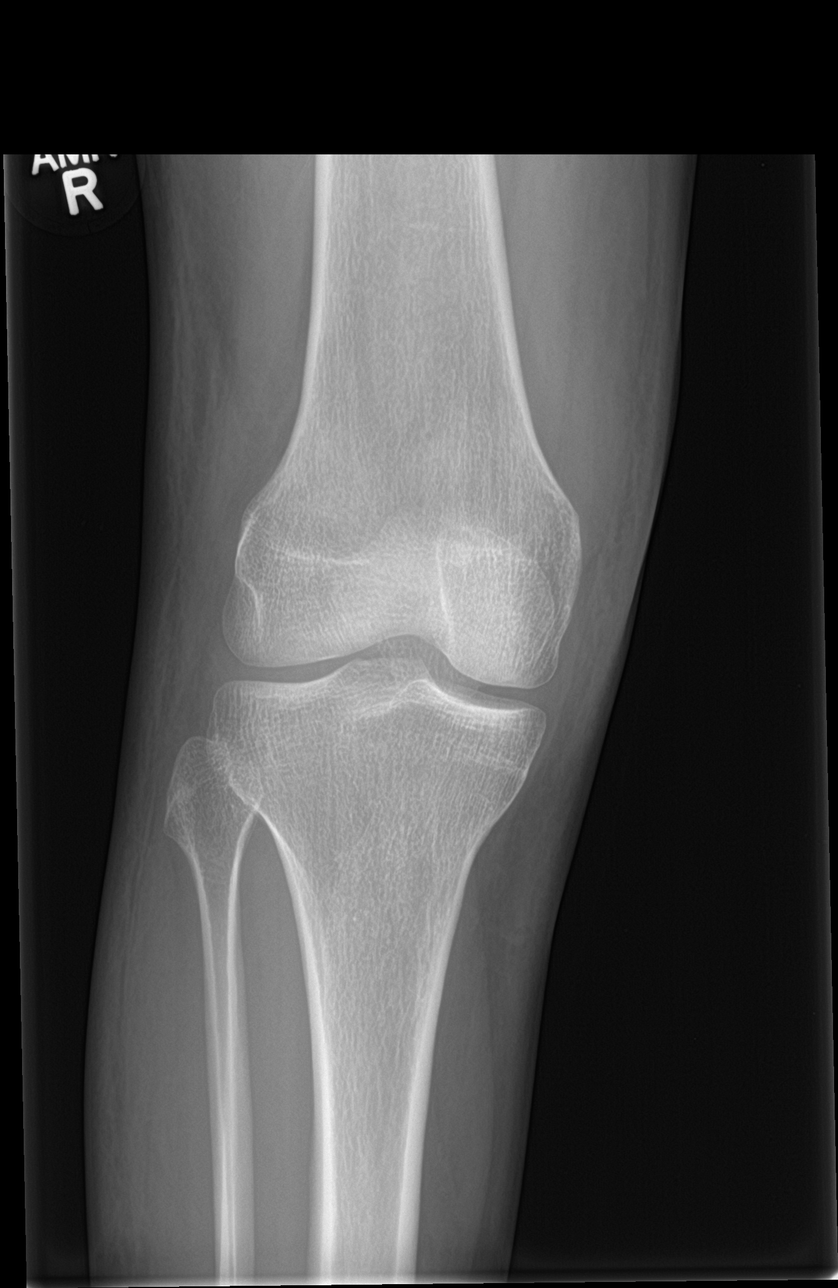

[tunnel]
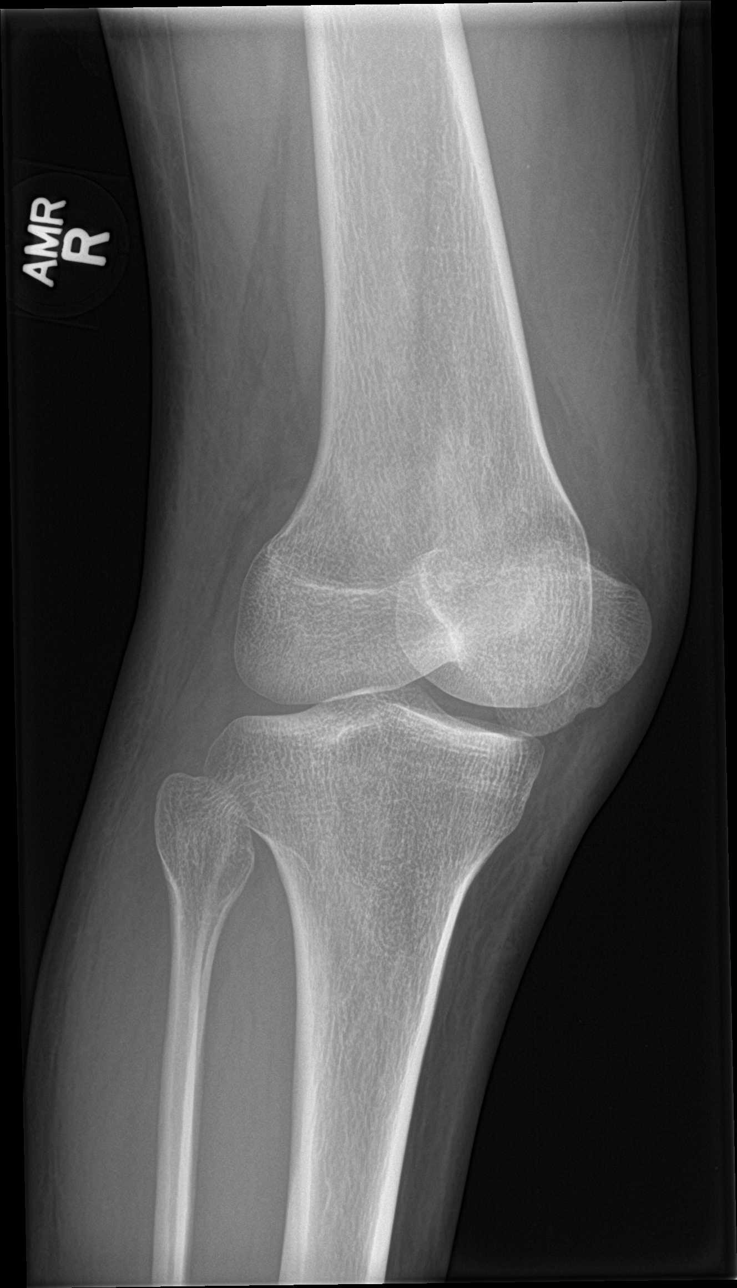

[knee lat]
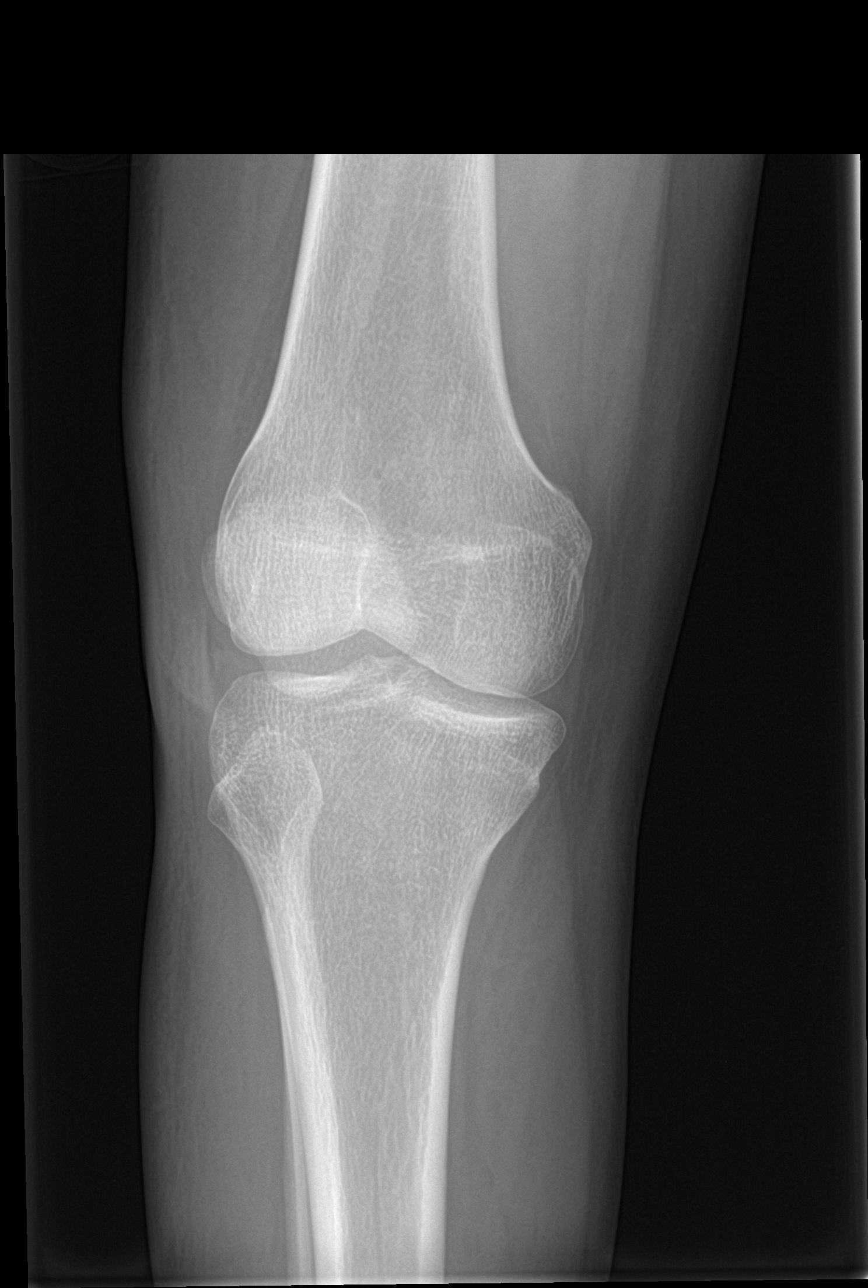

[knee sunrise]
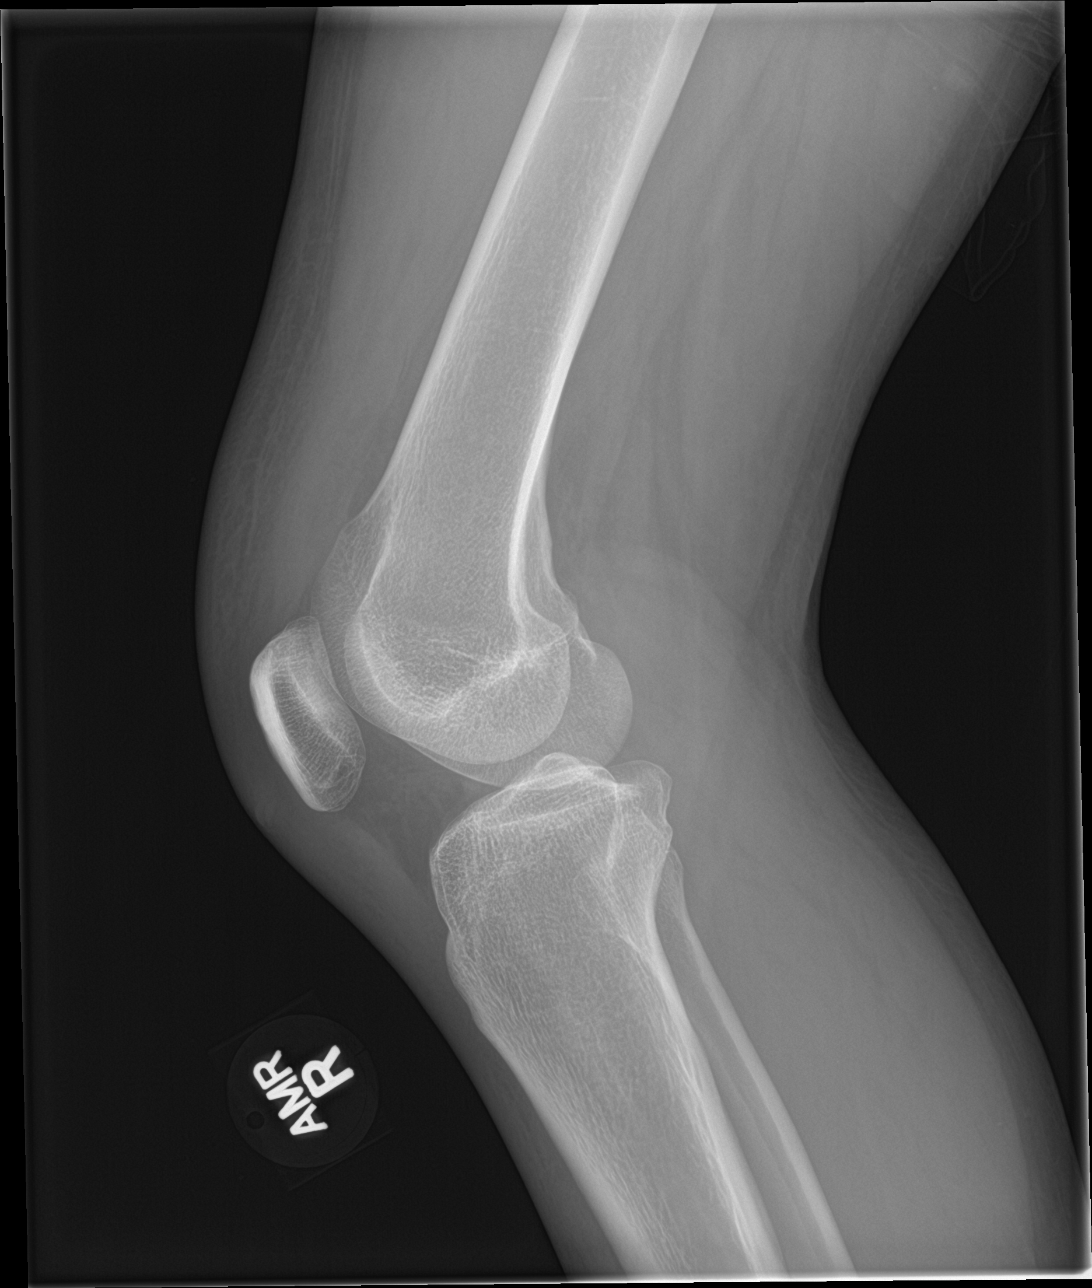

[4 of 4 positions shown; findings below may reference images not displayed]

FINDINGS: No evidence of fracture or dislocation. Small suprapatellar joint
effusion. No evidence of arthropathy or other focal bone
abnormality. Soft tissues are unremarkable.
IMPRESSION: Small suprapatellar effusion.  No fracture or dislocation.
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
